# Patient Record
Sex: Female | Born: 1980 | Race: White | Hispanic: Yes | Marital: Married | Smoking: Never smoker
Health system: Southern US, Community
[De-identification: ages and names within clinical notes are randomized; demographics above are authoritative.]

## PROBLEM LIST (undated history)

## (undated) ENCOUNTER — Inpatient Hospital Stay (HOSPITAL_COMMUNITY): Payer: Self-pay

## (undated) DIAGNOSIS — K219 Gastro-esophageal reflux disease without esophagitis: Secondary | ICD-10-CM

## (undated) DIAGNOSIS — D649 Anemia, unspecified: Secondary | ICD-10-CM

## (undated) DIAGNOSIS — L03116 Cellulitis of left lower limb: Secondary | ICD-10-CM

## (undated) DIAGNOSIS — M722 Plantar fascial fibromatosis: Secondary | ICD-10-CM

## (undated) DIAGNOSIS — J45909 Unspecified asthma, uncomplicated: Secondary | ICD-10-CM

## (undated) DIAGNOSIS — L02416 Cutaneous abscess of left lower limb: Secondary | ICD-10-CM

## (undated) HISTORY — PX: HERNIA REPAIR: SHX51

## (undated) HISTORY — PX: WISDOM TOOTH EXTRACTION: SHX21

## (undated) HISTORY — PX: APPENDECTOMY: SHX54

---

## 2008-05-25 ENCOUNTER — Encounter (INDEPENDENT_AMBULATORY_CARE_PROVIDER_SITE_OTHER): Payer: Self-pay | Admitting: *Deleted

## 2008-06-06 ENCOUNTER — Ambulatory Visit: Payer: Self-pay | Admitting: Family Medicine

## 2008-06-08 ENCOUNTER — Encounter (INDEPENDENT_AMBULATORY_CARE_PROVIDER_SITE_OTHER): Payer: Self-pay | Admitting: *Deleted

## 2008-06-08 LAB — CONVERTED CEMR LAB
ALT: 23 units/L (ref 0–35)
AST: 18 units/L (ref 0–37)
Albumin: 4 g/dL (ref 3.5–5.2)
Alkaline Phosphatase: 43 units/L (ref 39–117)
BUN: 8 mg/dL (ref 6–23)
Basophils Relative: 0.6 % (ref 0.0–3.0)
CO2: 26 meq/L (ref 19–32)
Chloride: 103 meq/L (ref 96–112)
Creatinine, Ser: 0.7 mg/dL (ref 0.4–1.2)
Eosinophils Relative: 0.4 % (ref 0.0–5.0)
LDL Cholesterol: 85 mg/dL (ref 0–99)
Lymphocytes Relative: 19.5 % (ref 12.0–46.0)
Neutrophils Relative %: 73.6 % (ref 43.0–77.0)
RBC: 4.32 M/uL (ref 3.87–5.11)
TSH: 0.99 microintl units/mL (ref 0.35–5.50)
Total Bilirubin: 0.9 mg/dL (ref 0.3–1.2)
Total CHOL/HDL Ratio: 3.3
VLDL: 25 mg/dL (ref 0–40)
WBC: 8.3 10*3/uL (ref 4.5–10.5)

## 2009-01-03 ENCOUNTER — Inpatient Hospital Stay (HOSPITAL_COMMUNITY): Admission: AD | Admit: 2009-01-03 | Discharge: 2009-01-05 | Payer: Self-pay | Admitting: Obstetrics and Gynecology

## 2010-07-26 LAB — CBC
HCT: 30.1 % — ABNORMAL LOW (ref 36.0–46.0)
Hemoglobin: 11.9 g/dL — ABNORMAL LOW (ref 12.0–15.0)
MCHC: 32.8 g/dL (ref 30.0–36.0)
Platelets: 171 10*3/uL (ref 150–400)
RDW: 14.8 % (ref 11.5–15.5)
RDW: 14.2 % (ref 11.5–15.5)

## 2010-07-26 LAB — RPR: RPR Ser Ql: NONREACTIVE

## 2011-05-01 ENCOUNTER — Other Ambulatory Visit: Payer: Self-pay | Admitting: Family Medicine

## 2011-05-01 DIAGNOSIS — N63 Unspecified lump in unspecified breast: Secondary | ICD-10-CM

## 2011-05-05 ENCOUNTER — Ambulatory Visit
Admission: RE | Admit: 2011-05-05 | Discharge: 2011-05-05 | Disposition: A | Discharge status: Home / Self Care | Payer: PRIVATE HEALTH INSURANCE | Source: Ambulatory Visit | Attending: Family Medicine | Admitting: Family Medicine

## 2011-05-05 DIAGNOSIS — N63 Unspecified lump in unspecified breast: Secondary | ICD-10-CM

## 2011-12-10 ENCOUNTER — Other Ambulatory Visit: Payer: Self-pay | Admitting: Family Medicine

## 2011-12-10 DIAGNOSIS — R599 Enlarged lymph nodes, unspecified: Secondary | ICD-10-CM

## 2011-12-12 ENCOUNTER — Ambulatory Visit
Admission: RE | Admit: 2011-12-12 | Discharge: 2011-12-12 | Disposition: A | Discharge status: Home / Self Care | Payer: Self-pay | Source: Ambulatory Visit | Attending: Family Medicine | Admitting: Family Medicine

## 2011-12-12 DIAGNOSIS — R599 Enlarged lymph nodes, unspecified: Secondary | ICD-10-CM

## 2012-05-26 ENCOUNTER — Other Ambulatory Visit: Payer: Self-pay | Admitting: Family Medicine

## 2012-05-26 DIAGNOSIS — N6310 Unspecified lump in the right breast, unspecified quadrant: Secondary | ICD-10-CM

## 2012-06-07 ENCOUNTER — Ambulatory Visit
Admission: RE | Admit: 2012-06-07 | Discharge: 2012-06-07 | Disposition: A | Discharge status: Home / Self Care | Payer: Self-pay | Source: Ambulatory Visit | Attending: Family Medicine | Admitting: Family Medicine

## 2012-06-07 DIAGNOSIS — N6310 Unspecified lump in the right breast, unspecified quadrant: Secondary | ICD-10-CM

## 2013-01-13 ENCOUNTER — Other Ambulatory Visit: Payer: Self-pay

## 2013-01-13 DIAGNOSIS — N649 Disorder of breast, unspecified: Secondary | ICD-10-CM

## 2013-01-19 ENCOUNTER — Other Ambulatory Visit: Payer: Self-pay | Admitting: Family Medicine

## 2013-01-19 DIAGNOSIS — N649 Disorder of breast, unspecified: Secondary | ICD-10-CM

## 2013-01-20 ENCOUNTER — Ambulatory Visit
Admission: RE | Admit: 2013-01-20 | Discharge: 2013-01-20 | Disposition: A | Discharge status: Home / Self Care | Payer: 59 | Source: Ambulatory Visit

## 2013-01-20 DIAGNOSIS — N649 Disorder of breast, unspecified: Secondary | ICD-10-CM

## 2014-07-05 ENCOUNTER — Telehealth: Payer: Self-pay | Admitting: Family Medicine

## 2014-07-05 NOTE — Telephone Encounter (Signed)
Caller name: Sharen Counterrriaga, Patti Relation to pt: self  Call back number: [phone]   Reason for call:  Pt states she would like to re establish care with Dr. Beverely Lowabori please advise

## 2014-07-05 NOTE — Telephone Encounter (Signed)
Ok to re-establish 

## 2014-10-20 ENCOUNTER — Encounter: Payer: Self-pay | Admitting: Family Medicine

## 2014-10-24 ENCOUNTER — Encounter: Payer: Self-pay | Admitting: Family Medicine

## 2014-10-24 ENCOUNTER — Ambulatory Visit (INDEPENDENT_AMBULATORY_CARE_PROVIDER_SITE_OTHER): Payer: 59 | Admitting: Family Medicine

## 2014-10-24 ENCOUNTER — Ambulatory Visit: Payer: Self-pay | Admitting: Family Medicine

## 2014-10-24 VITALS — BP 102/72 | HR 66 | Temp 98.4°F | Resp 16 | Ht 63.0 in | Wt 146.2 lb

## 2014-10-24 DIAGNOSIS — Z0000 Encounter for general adult medical examination without abnormal findings: Secondary | ICD-10-CM | POA: Insufficient documentation

## 2014-10-24 NOTE — Progress Notes (Signed)
Pre visit review using our clinic review tool, if applicable. No additional management support is needed unless otherwise documented below in the visit note. 

## 2014-10-24 NOTE — Progress Notes (Signed)
   Subjective:    Patient ID: Natalie Marquez, female    DOB: 01/19/1981, 34 y.o.   MRN: [id number]  HPI New to re-establish.  GYN- Fogelman (UTD).  No concerns today.   Review of Systems Patient reports no vision/ hearing changes, adenopathy,fever, weight change,  persistant/recurrent hoarseness , swallowing issues, chest pain, palpitations, edema, persistant/recurrent cough, hemoptysis, dyspnea (rest/exertional/paroxysmal nocturnal), gastrointestinal bleeding (melena, rectal bleeding), abdominal pain, significant heartburn, bowel changes, GU symptoms (dysuria, hematuria, incontinence), Gyn symptoms (abnormal  bleeding, pain),  syncope, focal weakness, memory loss, numbness & tingling, skin/hair/nail changes, abnormal bruising or bleeding, anxiety, or depression.     Objective:   Physical Exam General Appearance:    Alert, cooperative, no distress, appears stated age  Head:    Normocephalic, without obvious abnormality, atraumatic  Eyes:    PERRL, conjunctiva/corneas clear, EOM's intact, fundi    benign, both eyes  Ears:    Normal TM's and external ear canals, both ears  Nose:   Nares normal, septum midline, mucosa normal, no drainage    or sinus tenderness  Throat:   Lips, mucosa, and tongue normal; teeth and gums normal  Neck:   Supple, symmetrical, trachea midline, no adenopathy;    Thyroid: no enlargement/tenderness/nodules  Back:     Symmetric, no curvature, ROM normal, no CVA tenderness  Lungs:     Clear to auscultation bilaterally, respirations unlabored  Chest Wall:    No tenderness or deformity   Heart:    Regular rate and rhythm, S1 and S2 normal, no murmur, rub   or gallop  Breast Exam:    Deferred to GYN  Abdomen:     Soft, non-tender, bowel sounds active all four quadrants,    no masses, no organomegaly  Genitalia:    Deferred to GYN  Rectal:    Extremities:   Extremities normal, atraumatic, no cyanosis or edema  Pulses:   2+ and symmetric all extremities  Skin:   Skin  color, texture, turgor normal, no rashes or lesions  Lymph nodes:   Cervical, supraclavicular, and axillary nodes normal  Neurologic:   CNII-XII intact, normal strength, sensation and reflexes    throughout          Assessment & Plan:

## 2014-10-24 NOTE — Assessment & Plan Note (Signed)
Pt's PE WNL.  UTD on GYN.  Check labs.  Anticipatory guidance provided.  

## 2014-10-24 NOTE — Patient Instructions (Signed)
Follow up in 1 year or as needed We'll notify you of your lab results and make any changes if needed Keep up the good work!  You look great! Call with any questions or concerns Welcome!  We're glad to have you! 

## 2014-10-25 LAB — HEPATIC FUNCTION PANEL
ALBUMIN: 3.9 g/dL (ref 3.5–5.2)
ALK PHOS: 55 U/L (ref 39–117)
ALT: 13 U/L (ref 0–35)
AST: 13 U/L (ref 0–37)
BILIRUBIN TOTAL: 0.4 mg/dL (ref 0.2–1.2)
Bilirubin, Direct: 0.1 mg/dL (ref 0.0–0.3)
Total Protein: 7 g/dL (ref 6.0–8.3)

## 2014-10-25 LAB — BASIC METABOLIC PANEL
BUN: 9 mg/dL (ref 6–23)
CO2: 26 mEq/L (ref 19–32)
Calcium: 9.2 mg/dL (ref 8.4–10.5)
Chloride: 108 mEq/L (ref 96–112)
Creatinine, Ser: 0.67 mg/dL (ref 0.40–1.20)
GFR: 107.49 mL/min (ref >60.00)
GLUCOSE: 54 mg/dL — AB (ref 70–99)
POTASSIUM: 3.6 meq/L (ref 3.5–5.1)
Sodium: 140 mEq/L (ref 135–145)

## 2014-10-25 LAB — CBC WITH DIFFERENTIAL/PLATELET
BASOS PCT: 0.6 % (ref 0.0–3.0)
Basophils Absolute: 0 10*3/uL (ref 0.0–0.1)
Eosinophils Absolute: 0 10*3/uL (ref 0.0–0.7)
Eosinophils Relative: 0.6 % (ref 0.0–5.0)
HCT: 36.8 % (ref 36.0–46.0)
HEMOGLOBIN: 12.3 g/dL (ref 12.0–15.0)
LYMPHS PCT: 24.4 % (ref 12.0–46.0)
Lymphs Abs: 1.8 10*3/uL (ref 0.7–4.0)
MCHC: 33.5 g/dL (ref 30.0–36.0)
MCV: 87.1 fl (ref 78.0–100.0)
MONOS PCT: 5.8 % (ref 3.0–12.0)
Monocytes Absolute: 0.4 10*3/uL (ref 0.1–1.0)
NEUTROS ABS: 5.1 10*3/uL (ref 1.4–7.7)
Neutrophils Relative %: 68.6 % (ref 43.0–77.0)
Platelets: 249 10*3/uL (ref 150.0–400.0)
RBC: 4.23 Mil/uL (ref 3.87–5.11)
RDW: 13.3 % (ref 11.5–15.5)
WBC: 7.4 10*3/uL (ref 4.0–10.5)

## 2014-10-25 LAB — LIPID PANEL
CHOL/HDL RATIO: 3
CHOLESTEROL: 114 mg/dL (ref 0–200)
HDL: 36.3 mg/dL — ABNORMAL LOW (ref >39.00)
LDL CALC: 63 mg/dL (ref 0–99)
NonHDL: 77.7
TRIGLYCERIDES: 72 mg/dL (ref 0.0–149.0)
VLDL: 14.4 mg/dL (ref 0.0–40.0)

## 2014-10-25 LAB — TSH: TSH: 1.39 u[IU]/mL (ref 0.35–4.50)

## 2014-10-25 LAB — VITAMIN D 25 HYDROXY (VIT D DEFICIENCY, FRACTURES): VITD: 19.1 ng/mL — AB (ref 30.00–100.00)

## 2014-10-26 ENCOUNTER — Telehealth: Payer: Self-pay | Admitting: Family Medicine

## 2014-10-26 ENCOUNTER — Ambulatory Visit: Payer: Self-pay | Admitting: Family Medicine

## 2014-10-26 MED ORDER — VITAMIN D (ERGOCALCIFEROL) 1.25 MG (50000 UNIT) PO CAPS: 50000.0000 [IU] | ORAL_CAPSULE | ORAL | Status: DC

## 2014-10-26 NOTE — Telephone Encounter (Signed)
Pt returning call for lab results. She is at work. Asks that you call her between 12:15-1:00pm (her lunch) or after 3:00pm.

## 2014-10-26 NOTE — Telephone Encounter (Signed)
Returned patient call and notified of lab results.  See lab results for documentation.

## 2014-12-26 ENCOUNTER — Telehealth: Payer: Self-pay | Admitting: *Deleted

## 2014-12-26 NOTE — Telephone Encounter (Signed)
Pt dropped off wellness form for [facility]. Filled out as much as possible and forwarded to Dr. Beverely Low. JG//CMA

## 2015-01-04 NOTE — Telephone Encounter (Signed)
Faxed successfully to [facility] at [phone] and mailed to pt's home address. Copy sent for scanning. JG/CMA

## 2015-10-26 ENCOUNTER — Encounter: Payer: 59 | Admitting: Family Medicine

## 2016-03-17 ENCOUNTER — Encounter: Payer: Self-pay | Admitting: Physician Assistant

## 2016-03-17 ENCOUNTER — Ambulatory Visit (INDEPENDENT_AMBULATORY_CARE_PROVIDER_SITE_OTHER): Payer: 59 | Admitting: Physician Assistant

## 2016-03-17 ENCOUNTER — Ambulatory Visit
Admission: RE | Admit: 2016-03-17 | Discharge: 2016-03-17 | Disposition: A | Discharge status: Home / Self Care | Payer: 59 | Source: Ambulatory Visit | Attending: Physician Assistant | Admitting: Physician Assistant

## 2016-03-17 VITALS — BP 110/78 | HR 81 | Temp 98.3°F | Resp 14 | Ht 63.0 in | Wt 157.0 lb

## 2016-03-17 DIAGNOSIS — S4990XA Unspecified injury of shoulder and upper arm, unspecified arm, initial encounter: Secondary | ICD-10-CM

## 2016-03-17 MED ORDER — CYCLOBENZAPRINE HCL 10 MG PO TABS: 10.0000 mg | ORAL_TABLET | Freq: Every day | ORAL | 0 refills | Status: DC

## 2016-03-17 MED ORDER — MELOXICAM 15 MG PO TABS: 15.0000 mg | ORAL_TABLET | Freq: Every day | ORAL | 0 refills | Status: DC
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
## 2016-03-17 NOTE — Progress Notes (Signed)
Pre visit review using our clinic review tool, if applicable. No additional management support is needed unless otherwise documented below in the visit note. 

## 2016-03-17 NOTE — Patient Instructions (Addendum)
[facility] on [address] for your x-ray.  We will call with your results.  Avoid heavy lifting or overexertion. Continue [facility] Hot. A heating pad to the area for 10 minutes is a good idea. You can do this 2-3 times per day.  Take the Mobic once daily as directed with food. Tylenol for breakthrough pain.

## 2016-04-21 NOTE — L&D Delivery Note (Signed)
Delivery Note At 9:11 PM a viable and healthy female was delivered via  (Presentation: OA;  ).  APGAR:9 ,9 ; weight  pending  Placenta status: spontaneous, complete, .  Cord:  with the following complications: none .  Cord pH: N/A Anesthesia:  Epidural  Episiotomy:  none Lacerations:  1st degree perineal Suture Repair: 3.0 vicryl rapide Est. Blood Loss (mL):  250 cc  Mom to postpartum.  Baby to Couplet care / Skin to Skin.  Robley FriesVaishali R Tammie Ellsworth 03/17/2017, 9:31 PM

## 2016-09-01 DIAGNOSIS — Z118 Encounter for screening for other infectious and parasitic diseases: Secondary | ICD-10-CM | POA: Diagnosis not present

## 2016-12-27 ENCOUNTER — Inpatient Hospital Stay (HOSPITAL_COMMUNITY): Payer: 59

## 2016-12-27 ENCOUNTER — Inpatient Hospital Stay (HOSPITAL_COMMUNITY)
Admission: AD | Admit: 2016-12-27 | Discharge: 2016-12-27 | Disposition: A | Discharge status: Home / Self Care | Payer: 59 | Source: Ambulatory Visit | Attending: Obstetrics and Gynecology | Admitting: Obstetrics and Gynecology

## 2016-12-27 ENCOUNTER — Encounter (HOSPITAL_COMMUNITY): Payer: Self-pay

## 2016-12-27 ENCOUNTER — Other Ambulatory Visit: Payer: Self-pay | Admitting: Certified Nurse Midwife

## 2016-12-27 ENCOUNTER — Inpatient Hospital Stay (HOSPITAL_BASED_OUTPATIENT_CLINIC_OR_DEPARTMENT_OTHER)
Admission: RE | Admit: 2016-12-27 | Discharge: 2016-12-27 | Disposition: A | Discharge status: Home / Self Care | Payer: 59 | Source: Ambulatory Visit | Attending: Advanced Practice Midwife | Admitting: Advanced Practice Midwife

## 2016-12-27 DIAGNOSIS — L03116 Cellulitis of left lower limb: Secondary | ICD-10-CM

## 2016-12-27 DIAGNOSIS — O26892 Other specified pregnancy related conditions, second trimester: Secondary | ICD-10-CM | POA: Insufficient documentation

## 2016-12-27 DIAGNOSIS — O9989 Other specified diseases and conditions complicating pregnancy, childbirth and the puerperium: Secondary | ICD-10-CM

## 2016-12-27 DIAGNOSIS — R509 Fever, unspecified: Secondary | ICD-10-CM | POA: Diagnosis not present

## 2016-12-27 DIAGNOSIS — M79609 Pain in unspecified limb: Secondary | ICD-10-CM | POA: Diagnosis not present

## 2016-12-27 DIAGNOSIS — M79605 Pain in left leg: Secondary | ICD-10-CM | POA: Insufficient documentation

## 2016-12-27 DIAGNOSIS — M7989 Other specified soft tissue disorders: Secondary | ICD-10-CM

## 2016-12-27 DIAGNOSIS — D72829 Elevated white blood cell count, unspecified: Secondary | ICD-10-CM | POA: Diagnosis not present

## 2016-12-27 DIAGNOSIS — R0602 Shortness of breath: Secondary | ICD-10-CM | POA: Diagnosis not present

## 2016-12-27 DIAGNOSIS — R0600 Dyspnea, unspecified: Secondary | ICD-10-CM | POA: Insufficient documentation

## 2016-12-27 DIAGNOSIS — Z880 Allergy status to penicillin: Secondary | ICD-10-CM | POA: Insufficient documentation

## 2016-12-27 DIAGNOSIS — Z3A27 27 weeks gestation of pregnancy: Secondary | ICD-10-CM | POA: Diagnosis not present

## 2016-12-27 DIAGNOSIS — E7212 Methylenetetrahydrofolate reductase deficiency: Secondary | ICD-10-CM | POA: Diagnosis present

## 2016-12-27 DIAGNOSIS — Z7982 Long term (current) use of aspirin: Secondary | ICD-10-CM | POA: Insufficient documentation

## 2016-12-27 DIAGNOSIS — O99280 Endocrine, nutritional and metabolic diseases complicating pregnancy, unspecified trimester: Secondary | ICD-10-CM | POA: Diagnosis present

## 2016-12-27 LAB — URINALYSIS, ROUTINE W REFLEX MICROSCOPIC
Bilirubin Urine: NEGATIVE
Glucose, UA: NEGATIVE mg/dL
Hgb urine dipstick: NEGATIVE
LEUKOCYTES UA: NEGATIVE
NITRITE: NEGATIVE
PH: 8.5 — AB (ref 5.0–8.0)
Protein, ur: 30 mg/dL — AB
SPECIFIC GRAVITY, URINE: 1.01 (ref 1.005–1.030)

## 2016-12-27 LAB — CBC WITH DIFFERENTIAL/PLATELET
Basophils Absolute: 0 10*3/uL (ref 0.0–0.1)
Basophils Relative: 0 %
EOS ABS: 0 10*3/uL (ref 0.0–0.7)
EOS PCT: 0 %
HCT: 30.1 % — ABNORMAL LOW (ref 36.0–46.0)
Hemoglobin: 10.4 g/dL — ABNORMAL LOW (ref 12.0–15.0)
LYMPHS ABS: 0.6 10*3/uL — AB (ref 0.7–4.0)
Lymphocytes Relative: 3 %
MCH: 29.8 pg (ref 26.0–34.0)
MCHC: 34.6 g/dL (ref 30.0–36.0)
MCV: 86.2 fL (ref 78.0–100.0)
Monocytes Absolute: 0.5 10*3/uL (ref 0.1–1.0)
Monocytes Relative: 3 %
Neutro Abs: 17.6 10*3/uL — ABNORMAL HIGH (ref 1.7–7.7)
Neutrophils Relative %: 94 %
PLATELETS: 206 10*3/uL (ref 150–400)
RBC: 3.49 MIL/uL — AB (ref 3.87–5.11)
RDW: 13.7 % (ref 11.5–15.5)
WBC: 18.8 10*3/uL — AB (ref 4.0–10.5)

## 2016-12-27 LAB — URINALYSIS, MICROSCOPIC (REFLEX)

## 2016-12-27 LAB — INFLUENZA PANEL BY PCR (TYPE A & B)
INFLAPCR: NEGATIVE
Influenza B By PCR: NEGATIVE

## 2016-12-27 LAB — LACTIC ACID, PLASMA: Lactic Acid, Venous: 1.2 mmol/L (ref 0.5–1.9)

## 2016-12-27 MED ORDER — CEPHALEXIN 500 MG PO CAPS: 500.0000 mg | ORAL_CAPSULE | Freq: Four times a day (QID) | ORAL | 2 refills | Status: DC

## 2016-12-27 MED ORDER — CEFTRIAXONE SODIUM 1 G IJ SOLR
1.0000 g | Freq: Once | INTRAMUSCULAR | Status: AC
  Administered 2016-12-27: 1 g via INTRAVENOUS
  Filled 2016-12-27: qty 10

## 2016-12-27 MED ORDER — CEFTRIAXONE SODIUM 1 G IJ SOLR
500.0000 mg | Freq: Once | INTRAMUSCULAR | Status: DC
  Filled 2016-12-27: qty 10

## 2016-12-27 MED ORDER — ACETAMINOPHEN 325 MG PO TABS
650.0000 mg | ORAL_TABLET | Freq: Four times a day (QID) | ORAL | Status: DC | PRN
  Administered 2016-12-27: 650 mg via ORAL
  Filled 2016-12-27: qty 2

## 2016-12-27 MED ORDER — LACTATED RINGERS IV BOLUS (SEPSIS)
1000.0000 mL | Freq: Once | INTRAVENOUS | Status: AC
  Administered 2016-12-27: 1000 mL via INTRAVENOUS

## 2016-12-27 NOTE — Discharge Instructions (Signed)

## 2016-12-27 NOTE — MAU Note (Addendum)
Pt was feeling SOB at work yesterday and having the chills.  Was vomiting after eating last night. Having pain down her left thigh and lower leg.  Today the pain has not gotten any better and she is still SOB. Pt was limping when she was walking.

## 2016-12-27 NOTE — Progress Notes (Signed)
VASCULAR LAB PRELIMINARY  PRELIMINARY  PRELIMINARY  PRELIMINARY  Left lower extremity venous duplex completed.    Preliminary report:  There is no DVT or SVT noted in the left lower extremity.  Multiple enlarged inguinal lymph nodes noted.  Lyliana Dicenso, RVT 12/27/2016, 11:58 AM

## 2016-12-27 NOTE — MAU Provider Note (Signed)
Chief Complaint:  Shortness of Breath; Leg Pain; and Fever   First Provider Initiated Contact with Patient 12/27/16 [phone]     HPI: Natalie Marquez is a 36 y.o. G5P1031 at 4827w0dwho presents to maternity admissions reporting left leg pain and redness.  Began feeling short of breath yesterday with chills and fever.  Had some vomiting last night.  Had an incident like this years ago and was hospitalized for 10 days.  Also described this pain early pregnancy on left thigh.  History is remarkable for MTHFR homozygous and is on ASA for this.. She reports good fetal movement, denies LOF, vaginal bleeding, vaginal itching/burning, urinary symptoms, h/a, dizziness, n/v, diarrhea, constipation.  Shortness of Breath  This is a new problem. The current episode started yesterday. The problem occurs constantly. The problem has been unchanged. Associated symptoms include a fever, leg pain, leg swelling (pt reports it but there is no obvious swelling) and orthopnea. Pertinent negatives include no abdominal pain, claudication, rhinorrhea, sore throat, sputum production, syncope or vomiting. The symptoms are aggravated by any activity. Risk factors: MTHFR and pregnancy status. She has tried nothing for the symptoms.  Leg Pain   The incident occurred 12 to 24 hours ago. There was no injury mechanism. The pain is present in the left leg. The pain is moderate. The pain has been constant since onset. Associated symptoms include an inability to bear weight. Pertinent negatives include no loss of sensation, muscle weakness or numbness. She reports no foreign bodies present. The symptoms are aggravated by movement and weight bearing. She has tried nothing for the symptoms.  Fever   This is a new problem. The current episode started yesterday. The problem occurs constantly. The problem has been gradually improving. The maximum temperature noted was 100 to 100.9 F. The temperature was taken using an oral thermometer. Pertinent  negatives include no abdominal pain, congestion, coughing, diarrhea, sore throat or vomiting. She has tried nothing for the symptoms.    RN note: Pt was feeling SOB at work yesterday and having the chills.  Was vomiting after eating last night. Having pain down her left thigh and lower leg.  Today the pain has not gotten any better and she is still SOB. Pt was limping when she was walking.  Past Medical History: History reviewed. No pertinent past medical history.  Past obstetric history: OB History  Gravida Para Term Preterm AB Living  5 1 1   3 1   SAB TAB Ectopic Multiple Live Births  3       1    # Outcome Date GA Lbr Len/2nd Weight Sex Delivery Anes PTL Lv  5 Current           4 Term         LIV  3 SAB           2 SAB           1 SAB               Past Surgical History: Past Surgical History:  Procedure Laterality Date  . APPENDECTOMY    . HERNIA REPAIR    . WISDOM TOOTH EXTRACTION      Family History: Family History  Problem Relation Age of Onset  . Hypertension Mother   . Hypertension Father   . Cancer Maternal Grandmother        uterine  . Heart disease Maternal Grandfather   . Thyroid disease Paternal Grandmother     Social History: Social  History  Substance Use Topics  . Smoking status: Never Smoker  . Smokeless tobacco: Never Used  . Alcohol use No    Allergies:  Allergies  Allergen Reactions  . Penicillins Rash    Meds:  Prescriptions Prior to Admission  Medication Sig Dispense Refill Last Dose  . cyclobenzaprine (FLEXERIL) 10 MG tablet Take 1 tablet (10 mg total) by mouth at bedtime. 15 tablet 0   . meloxicam (MOBIC) 15 MG tablet Take 1 tablet (15 mg total) by mouth daily. 30 tablet 0     I have reviewed patient's Past Medical Hx, Surgical Hx, Family Hx, Social Hx, medications and allergies.   ROS:  Review of Systems  Constitutional: Positive for fever.  HENT: Negative for congestion, rhinorrhea and sore throat.   Respiratory:  Positive for shortness of breath. Negative for cough and sputum production.   Cardiovascular: Positive for orthopnea and leg swelling (pt reports it but there is no obvious swelling). Negative for claudication and syncope.  Gastrointestinal: Negative for abdominal pain, diarrhea and vomiting.  Neurological: Negative for numbness.   Other systems negative  Physical Exam  Patient Vitals for the past 24 hrs:  BP Temp Temp src Pulse Resp SpO2  12/27/16 1009 - - - (!) 124 - 100 %  12/27/16 1006 - - - (!) 119 - 98 %  12/27/16 1001 - - - - - 99 %  12/27/16 0956 - - - - - 96 %  12/27/16 0951 - - - - - 99 %  12/27/16 0949 - - - (!) 116 - 97 %  12/27/16 0947 119/66 99.6 F (37.6 C) Oral (!) 119 20 96 %   Constitutional: Well-developed, well-nourished female in no acute distress, but dyspneic and somewhat ill-appearing.    Cardiovascular: Tachycardic with regular rhythm   S1/S2 audible, with no ectopy Respiratory: normal effort, but rapid respiration clear to auscultation bilaterally, diminished bases GI: Abd soft, non-tender, gravid appropriate for gestational age.   No rebound or guarding. MS: Extremities nontender, no edema, normal ROM     There is an oval area of erethema on left lower extremity,  Area is warm and slightly tender, but not edematous   See photo below.  Circumference marked Neurologic: Alert and oriented x 4.  GU: Neg CVAT.  PELVIC EXAM: not indicated   FHT:  Baseline 150 , moderate variability, accelerations present, no decelerations Contractions: Rare      Labs: Results for orders placed or performed during the hospital encounter of 12/27/16 (from the past 24 hour(s))  Urinalysis, Routine w reflex microscopic     Status: Abnormal   Collection Time: 12/27/16  9:38 AM  Result Value Ref Range   Color, Urine AMBER (A) YELLOW   APPearance HAZY (A) CLEAR   Specific Gravity, Urine 1.010 1.005 - 1.030   pH 8.5 (H) 5.0 - 8.0   Glucose, UA NEGATIVE NEGATIVE mg/dL   Hgb  urine dipstick NEGATIVE NEGATIVE   Bilirubin Urine NEGATIVE NEGATIVE   Ketones, ur >80 (A) NEGATIVE mg/dL   Protein, ur 30 (A) NEGATIVE mg/dL   Nitrite NEGATIVE NEGATIVE   Leukocytes, UA NEGATIVE NEGATIVE  Urinalysis, Microscopic (reflex)     Status: Abnormal   Collection Time: 12/27/16  9:38 AM  Result Value Ref Range   RBC / HPF 0-5 0 - 5 RBC/hpf   WBC, UA 0-5 0 - 5 WBC/hpf   Bacteria, UA FEW (A) NONE SEEN   Squamous Epithelial / LPF 6-30 (A) NONE SEEN  Amorphous Crystal PRESENT   CBC with Differential/Platelet     Status: Abnormal   Collection Time: 12/27/16 10:28 AM  Result Value Ref Range   WBC 18.8 (H) 4.0 - 10.5 K/uL   RBC 3.49 (L) 3.87 - 5.11 MIL/uL   Hemoglobin 10.4 (L) 12.0 - 15.0 g/dL   HCT 86.5 (L) 78.4 - 69.6 %   MCV 86.2 78.0 - 100.0 fL   MCH 29.8 26.0 - 34.0 pg   MCHC 34.6 30.0 - 36.0 g/dL   RDW 29.5 28.4 - 13.2 %   Platelets 206 150 - 400 K/uL   Neutrophils Relative % 94 %   Neutro Abs 17.6 (H) 1.7 - 7.7 K/uL   Lymphocytes Relative 3 %   Lymphs Abs 0.6 (L) 0.7 - 4.0 K/uL   Monocytes Relative 3 %   Monocytes Absolute 0.5 0.1 - 1.0 K/uL   Eosinophils Relative 0 %   Eosinophils Absolute 0.0 0.0 - 0.7 K/uL   Basophils Relative 0 %   Basophils Absolute 0.0 0.0 - 0.1 K/uL  Influenza panel by PCR (type A & B)     Status: None   Collection Time: 12/27/16 11:40 AM  Result Value Ref Range   Influenza A By PCR NEGATIVE NEGATIVE   Influenza B By PCR NEGATIVE NEGATIVE  Lactic acid, plasma     Status: None   Collection Time: 12/27/16 12:02 PM  Result Value Ref Range   Lactic Acid, Venous 1.2 0.5 - 1.9 mmol/L    Imaging: Dg Chest 2 View  Result Date: 12/27/2016 CLINICAL DATA:  Shortness of breath.  Fever. EXAM: CHEST  2 VIEW COMPARISON:  None. FINDINGS: The heart size and mediastinal contours are within normal limits. Both lungs are clear. The visualized skeletal structures are unremarkable. IMPRESSION: No active cardiopulmonary disease. Electronically Signed   By:  Gerome Sam III M.D   On: 12/27/2016 10:47   Verbal report on LE doppler was negative  MAU Course/MDM: I have ordered labs and reviewed results. Leukocytosis with left shift.  UA clear.  CXR clear.  NST reviewed and found to be reassuring. Consult Dr Billy Coast with presentation, exam findings and test results.  Treatments in MAU included IV hydration and IV Rocephin.  Tylenol given for fever.  Erethema marked and photographed.  DD:  Cellultis vs ?Lyme.   DVT ruled out.  Tachypnea and lowered O2 saturation improved. .    Discussed findings.  Warning signs to return for were reviewed.  Has appt on Tuesday with Dr Ernestina Penna  Assessment: 1. Dyspnea   2.     Left leg pain and erethema 3.     Fever of unknown origin 4.      Leukocytosis with left shift   Plan: Discharge home Rx Keflex x 7 days Tylenol prn fever Preterm Labor precautions and fetal kick counts Follow up in Office for prenatal visits and recheck of status Out of work until Tuesday  Encouraged to return here or to other Urgent Care/ED if she develops worsening of symptoms, increase in pain, fever, or other concerning symptoms.   Pt stable at time of discharge.  Wynelle Bourgeois CNM, MSN Certified Nurse-Midwife 12/27/2016 10:43 AM

## 2016-12-27 NOTE — Progress Notes (Signed)
Spoke with Selena BattenKim in Vascular Lab about order, will come asap.

## 2016-12-29 LAB — B. BURGDORFI ANTIBODIES: B burgdorferi Ab IgG+IgM: <0.91 {ISR} (ref 0.00–0.90)

## 2017-01-07 DIAGNOSIS — Z23 Encounter for immunization: Secondary | ICD-10-CM | POA: Diagnosis not present

## 2017-01-21 DIAGNOSIS — Z23 Encounter for immunization: Secondary | ICD-10-CM | POA: Diagnosis not present

## 2017-03-17 ENCOUNTER — Encounter (HOSPITAL_COMMUNITY): Payer: Self-pay | Admitting: *Deleted

## 2017-03-17 ENCOUNTER — Inpatient Hospital Stay (HOSPITAL_COMMUNITY)
Admission: AD | Admit: 2017-03-17 | Discharge: 2017-03-19 | DRG: 806 | Disposition: A | Discharge status: Home / Self Care | Payer: 59 | Source: Ambulatory Visit | Attending: Obstetrics & Gynecology | Admitting: Obstetrics & Gynecology

## 2017-03-17 ENCOUNTER — Encounter (HOSPITAL_COMMUNITY): Payer: Self-pay | Admitting: Certified Registered Nurse Anesthetist

## 2017-03-17 ENCOUNTER — Inpatient Hospital Stay (HOSPITAL_COMMUNITY): Payer: 59 | Admitting: Anesthesiology

## 2017-03-17 DIAGNOSIS — K219 Gastro-esophageal reflux disease without esophagitis: Secondary | ICD-10-CM | POA: Diagnosis present

## 2017-03-17 DIAGNOSIS — Z3483 Encounter for supervision of other normal pregnancy, third trimester: Secondary | ICD-10-CM | POA: Diagnosis present

## 2017-03-17 DIAGNOSIS — Z3490 Encounter for supervision of normal pregnancy, unspecified, unspecified trimester: Secondary | ICD-10-CM

## 2017-03-17 DIAGNOSIS — O99284 Endocrine, nutritional and metabolic diseases complicating childbirth: Secondary | ICD-10-CM | POA: Diagnosis present

## 2017-03-17 DIAGNOSIS — O9962 Diseases of the digestive system complicating childbirth: Secondary | ICD-10-CM | POA: Diagnosis present

## 2017-03-17 DIAGNOSIS — E7212 Methylenetetrahydrofolate reductase deficiency: Secondary | ICD-10-CM | POA: Diagnosis present

## 2017-03-17 DIAGNOSIS — Z3A38 38 weeks gestation of pregnancy: Secondary | ICD-10-CM

## 2017-03-17 HISTORY — DX: Anemia, unspecified: D64.9

## 2017-03-17 HISTORY — DX: Cutaneous abscess of left lower limb: L02.416

## 2017-03-17 HISTORY — DX: Gastro-esophageal reflux disease without esophagitis: K21.9

## 2017-03-17 HISTORY — DX: Cellulitis of left lower limb: L03.116

## 2017-03-17 LAB — OB RESULTS CONSOLE HEPATITIS B SURFACE ANTIGEN: HEP B S AG: NEGATIVE

## 2017-03-17 LAB — ABO/RH: ABO/RH(D): O POS

## 2017-03-17 LAB — TYPE AND SCREEN
ABO/RH(D): O POS
ANTIBODY SCREEN: NEGATIVE

## 2017-03-17 LAB — CBC
HCT: 33.7 % — ABNORMAL LOW (ref 36.0–46.0)
HEMOGLOBIN: 11.2 g/dL — AB (ref 12.0–15.0)
MCH: 29.9 pg (ref 26.0–34.0)
MCHC: 33.2 g/dL (ref 30.0–36.0)
MCV: 89.9 fL (ref 78.0–100.0)
Platelets: 228 10*3/uL (ref 150–400)
RBC: 3.75 MIL/uL — AB (ref 3.87–5.11)
RDW: 15.4 % (ref 11.5–15.5)
WBC: 10.6 10*3/uL — ABNORMAL HIGH (ref 4.0–10.5)

## 2017-03-17 LAB — OB RESULTS CONSOLE RUBELLA ANTIBODY, IGM: RUBELLA: IMMUNE

## 2017-03-17 LAB — OB RESULTS CONSOLE GC/CHLAMYDIA
CHLAMYDIA, DNA PROBE: NEGATIVE
Gonorrhea: NEGATIVE

## 2017-03-17 LAB — OB RESULTS CONSOLE ANTIBODY SCREEN: Antibody Screen: NEGATIVE

## 2017-03-17 LAB — OB RESULTS CONSOLE RPR: RPR: NONREACTIVE

## 2017-03-17 LAB — OB RESULTS CONSOLE ABO/RH: RH TYPE: POSITIVE

## 2017-03-17 LAB — OB RESULTS CONSOLE GBS: GBS: NEGATIVE

## 2017-03-17 LAB — OB RESULTS CONSOLE HIV ANTIBODY (ROUTINE TESTING): HIV: NONREACTIVE

## 2017-03-17 MED ORDER — EPHEDRINE 5 MG/ML INJ
10.0000 mg | INTRAVENOUS | Status: DC | PRN
  Filled 2017-03-17: qty 2

## 2017-03-17 MED ORDER — FENTANYL 2.5 MCG/ML BUPIVACAINE 1/10 % EPIDURAL INFUSION (WH - ANES)
14.0000 mL/h | INTRAMUSCULAR | Status: DC | PRN
  Administered 2017-03-17: 14 mL/h via EPIDURAL
  Filled 2017-03-17: qty 100

## 2017-03-17 MED ORDER — BENZOCAINE-MENTHOL 20-0.5 % EX AERO: 1.0000 "application " | INHALATION_SPRAY | CUTANEOUS | Status: DC | PRN

## 2017-03-17 MED ORDER — PHENYLEPHRINE 40 MCG/ML (10ML) SYRINGE FOR IV PUSH (FOR BLOOD PRESSURE SUPPORT)
80.0000 ug | PREFILLED_SYRINGE | INTRAVENOUS | Status: DC | PRN
  Filled 2017-03-17: qty 10
  Filled 2017-03-17: qty 5

## 2017-03-17 MED ORDER — OXYTOCIN 40 UNITS IN LACTATED RINGERS INFUSION - SIMPLE MED
1.0000 m[IU]/min | INTRAVENOUS | Status: DC
  Administered 2017-03-17: 2 m[IU]/min via INTRAVENOUS
  Filled 2017-03-17 (×2): qty 1000

## 2017-03-17 MED ORDER — DIBUCAINE 1 % RE OINT: 1.0000 "application " | TOPICAL_OINTMENT | RECTAL | Status: DC | PRN

## 2017-03-17 MED ORDER — LIDOCAINE HCL (PF) 1 % IJ SOLN
30.0000 mL | INTRAMUSCULAR | Status: DC | PRN
  Filled 2017-03-17: qty 30

## 2017-03-17 MED ORDER — FAMOTIDINE 20 MG PO TABS
10.0000 mg | ORAL_TABLET | Freq: Every day | ORAL | Status: DC
  Administered 2017-03-18: 10 mg via ORAL
  Filled 2017-03-17: qty 1

## 2017-03-17 MED ORDER — COCONUT OIL OIL: 1.0000 "application " | TOPICAL_OIL | Status: DC | PRN

## 2017-03-17 MED ORDER — OXYTOCIN 40 UNITS IN LACTATED RINGERS INFUSION - SIMPLE MED
2.5000 [IU]/h | INTRAVENOUS | Status: DC
  Administered 2017-03-17: 2.5 [IU]/h via INTRAVENOUS

## 2017-03-17 MED ORDER — LACTATED RINGERS IV SOLN
INTRAVENOUS | Status: DC
  Administered 2017-03-17: 125 mL/h via INTRAVENOUS

## 2017-03-17 MED ORDER — DIPHENHYDRAMINE HCL 25 MG PO CAPS: 25.0000 mg | ORAL_CAPSULE | Freq: Four times a day (QID) | ORAL | Status: DC | PRN

## 2017-03-17 MED ORDER — ONDANSETRON HCL 4 MG/2ML IJ SOLN: 4.0000 mg | INTRAMUSCULAR | Status: DC | PRN

## 2017-03-17 MED ORDER — FOLIC ACID 1 MG PO TABS
4.0000 mg | ORAL_TABLET | Freq: Every day | ORAL | Status: DC
  Administered 2017-03-18: 4 mg via ORAL
  Filled 2017-03-17 (×3): qty 4

## 2017-03-17 MED ORDER — IBUPROFEN 600 MG PO TABS
600.0000 mg | ORAL_TABLET | Freq: Four times a day (QID) | ORAL | Status: DC
  Administered 2017-03-18 – 2017-03-19 (×6): 600 mg via ORAL
  Filled 2017-03-17 (×6): qty 1

## 2017-03-17 MED ORDER — SIMETHICONE 80 MG PO CHEW: 80.0000 mg | CHEWABLE_TABLET | ORAL | Status: DC | PRN

## 2017-03-17 MED ORDER — OXYCODONE-ACETAMINOPHEN 5-325 MG PO TABS: 1.0000 | ORAL_TABLET | ORAL | Status: DC | PRN

## 2017-03-17 MED ORDER — DIPHENHYDRAMINE HCL 50 MG/ML IJ SOLN: 12.5000 mg | INTRAMUSCULAR | Status: DC | PRN

## 2017-03-17 MED ORDER — TETANUS-DIPHTH-ACELL PERTUSSIS 5-2.5-18.5 LF-MCG/0.5 IM SUSP: 0.5000 mL | Freq: Once | INTRAMUSCULAR | Status: DC

## 2017-03-17 MED ORDER — ZOLPIDEM TARTRATE 5 MG PO TABS: 5.0000 mg | ORAL_TABLET | Freq: Every evening | ORAL | Status: DC | PRN

## 2017-03-17 MED ORDER — SENNOSIDES-DOCUSATE SODIUM 8.6-50 MG PO TABS
2.0000 | ORAL_TABLET | ORAL | Status: DC
  Administered 2017-03-18 – 2017-03-19 (×2): 2 via ORAL
  Filled 2017-03-17 (×2): qty 2

## 2017-03-17 MED ORDER — WITCH HAZEL-GLYCERIN EX PADS: 1.0000 "application " | MEDICATED_PAD | CUTANEOUS | Status: DC | PRN

## 2017-03-17 MED ORDER — OXYCODONE-ACETAMINOPHEN 5-325 MG PO TABS: 2.0000 | ORAL_TABLET | ORAL | Status: DC | PRN

## 2017-03-17 MED ORDER — SOD CITRATE-CITRIC ACID 500-334 MG/5ML PO SOLN: 30.0000 mL | ORAL | Status: DC | PRN

## 2017-03-17 MED ORDER — ACETAMINOPHEN 325 MG PO TABS
650.0000 mg | ORAL_TABLET | ORAL | Status: DC | PRN
  Administered 2017-03-17: 650 mg via ORAL
  Filled 2017-03-17: qty 2

## 2017-03-17 MED ORDER — OXYTOCIN BOLUS FROM INFUSION
500.0000 mL | Freq: Once | INTRAVENOUS | Status: AC
  Administered 2017-03-17: 500 mL via INTRAVENOUS

## 2017-03-17 MED ORDER — ONDANSETRON HCL 4 MG/2ML IJ SOLN: 4.0000 mg | Freq: Four times a day (QID) | INTRAMUSCULAR | Status: DC | PRN

## 2017-03-17 MED ORDER — LACTATED RINGERS IV SOLN: 500.0000 mL | Freq: Once | INTRAVENOUS | Status: DC

## 2017-03-17 MED ORDER — TERBUTALINE SULFATE 1 MG/ML IJ SOLN
0.2500 mg | Freq: Once | INTRAMUSCULAR | Status: DC | PRN
  Filled 2017-03-17: qty 1

## 2017-03-17 MED ORDER — PRENATAL MULTIVITAMIN CH
1.0000 | ORAL_TABLET | Freq: Every day | ORAL | Status: DC
  Administered 2017-03-18: 1 via ORAL
  Filled 2017-03-17: qty 1

## 2017-03-17 MED ORDER — PHENYLEPHRINE 40 MCG/ML (10ML) SYRINGE FOR IV PUSH (FOR BLOOD PRESSURE SUPPORT)
80.0000 ug | PREFILLED_SYRINGE | INTRAVENOUS | Status: DC | PRN
  Filled 2017-03-17: qty 5

## 2017-03-17 MED ORDER — ONDANSETRON HCL 4 MG PO TABS: 4.0000 mg | ORAL_TABLET | ORAL | Status: DC | PRN

## 2017-03-17 MED ORDER — LIDOCAINE HCL (PF) 1 % IJ SOLN
INTRAMUSCULAR | Status: DC | PRN
  Administered 2017-03-17: 8 mL via EPIDURAL
  Administered 2017-03-17: 4 mL via EPIDURAL

## 2017-03-17 MED ORDER — ACETAMINOPHEN 325 MG PO TABS
650.0000 mg | ORAL_TABLET | ORAL | Status: DC | PRN
Start: 2017-03-17 — End: 2017-03-19

## 2017-03-17 MED ORDER — LACTATED RINGERS IV SOLN: 500.0000 mL | INTRAVENOUS | Status: DC | PRN

## 2017-03-17 NOTE — Progress Notes (Signed)
S: Doing well, no complaints, pain well controlled with3 epidural  O: BP (!) 91/51   Pulse 94   Temp 98.2 F (36.8 C) (Oral)   Resp 16   Ht 5\' 3"  (1.6 m)   Wt 73 kg (161 lb)   SpO2 99%   BMI 28.52 kg/m    FHT:  FHR: 140s bpm, variability: moderate,  accelerations:  Present,  decelerations:  Present sharp, short variables UC:   Not tracing SVE:   Dilation: 4 Effacement (%): 70 Station: -2 Exam by:: lee  5/90/-1  A / P:  36 y.o.  Obstetric History   G5   P1   T1   P0   A3   L1    SAB3   TAB0   Ectopic0   Multiple0   Live Births1    at 5371w3d IOL after SROM, good progress. IUPC placed to monitor pitocin as external toco not ttracing. Will watch variables for now but iff continue may need amnioinfusion  Fetal Wellbeing:  Category I Pain Control:  Epidural  Anticipated MOD:  NSVD  Natalie Marquez 03/17/2017, 7:03 PM

## 2017-03-17 NOTE — Anesthesia Preprocedure Evaluation (Signed)
Anesthesia Evaluation  Patient identified by MRN, date of birth, ID band Patient awake    Reviewed: Allergy & Precautions, NPO status , Patient's Chart, lab work & pertinent test results  Airway Mallampati: II  TM Distance: >3 FB Neck ROM: Full    Dental  (+) Dental Advisory Given   Pulmonary neg pulmonary ROS,    Pulmonary exam normal breath sounds clear to auscultation       Cardiovascular negative cardio ROS Normal cardiovascular exam Rhythm:Regular Rate:Normal     Neuro/Psych negative neurological ROS  negative psych ROS   GI/Hepatic Neg liver ROS, GERD  ,  Endo/Other  negative endocrine ROS  Renal/GU negative Renal ROS  negative genitourinary   Musculoskeletal negative musculoskeletal ROS (+)   Abdominal   Peds  Hematology  (+) Blood dyscrasia, anemia , MTHFR deficiency on aspirin   Anesthesia Other Findings   Reproductive/Obstetrics (+) Pregnancy                             Anesthesia Physical Anesthesia Plan  ASA: II  Anesthesia Plan: Epidural   Post-op Pain Management:    Induction:   PONV Risk Score and Plan:   Airway Management Planned: Natural Airway  Additional Equipment:   Intra-op Plan:   Post-operative Plan:   Informed Consent: I have reviewed the patients History and Physical, chart, labs and discussed the procedure including the risks, benefits and alternatives for the proposed anesthesia with the patient or authorized representative who has indicated his/her understanding and acceptance.     Plan Discussed with:   Anesthesia Plan Comments: (Labs reviewed. Platelets acceptable, patient not taking any blood thinning medications. Risks and benefits discussed with patient, patient expressed understanding and wished to proceed.)        Anesthesia Quick Evaluation

## 2017-03-17 NOTE — Anesthesia Pain Management Evaluation Note (Signed)
  CRNA Pain Management Visit Note  Patient: Natalie Marquez, 36 y.o., female  "Hello I am a member of the anesthesia team at [facility]. We have an anesthesia team available at all times to provide care throughout the hospital, including epidural management and anesthesia for C-section. I don't know your plan for the delivery whether it a natural birth, water birth, IV sedation, nitrous supplementation, doula or epidural, but we want to meet your pain goals."   1.Was your pain managed to your expectations on prior hospitalizations?   Yes   2.What is your expectation for pain management during this hospitalization?     Epidural  3.How can we help you reach that goal? Epidural when ready.  Record the patient's initial score and the patient's pain goal.   Pain: 4-6  Pain Goal: 4-6 [facility] wants you to be able to say your pain was always managed very well.  Natalie Marquez L 03/17/2017

## 2017-03-17 NOTE — H&P (Signed)
Natalie Marquez is a 36 y.o. female presenting for SROM at 8 am, clear fluid. Mild contractions, no bleeding, +FMs.  G1- uncomplicated term pregnancy and SVD after 41 wk long IOL.  G2-G4 - 3 SABs in 1st trim. W/up negative except Homozygous for MTHFR mutation and hence on high dose folic acid in preg  This pregnancy overall ok, took prometrium in 1st trim. Folic acid. Leg cellulitis and needing antibiotic at 17 wks. GERD needing Ranitidine  OB History    Gravida Para Term Preterm AB Living   5 1 1   3 1    SAB TAB Ectopic Multiple Live Births   3       1     Past Medical History:  Diagnosis Date  . Anemia   . Cellulitis and abscess of left leg   . GERD (gastroesophageal reflux disease)    Past Surgical History:  Procedure Laterality Date  . APPENDECTOMY    . HERNIA REPAIR    . WISDOM TOOTH EXTRACTION     Family History: family history includes Cancer in her maternal grandmother; Heart disease in her maternal grandfather; Hypertension in her father and mother; Thyroid disease in her paternal grandmother. Social History:  reports that  has never smoked. she has never used smokeless tobacco. She reports that she does not drink alcohol or use drugs.     Maternal Diabetes: No Genetic Screening: Normal Maternal Ultrasounds/Referrals: Normal Fetal Ultrasounds or other Referrals:  None Maternal Substance Abuse:  No Significant Maternal Medications:  Meds include: Progesterone Other:  Ranitidine, Folic acid, Diclegis prn for sleep aid Significant Maternal Lab Results:  Lab values include: Group B Strep negative Other Comments:  None  ROS neg History Dilation: 1.5 Effacement (%): Thick Exam by:: lee Blood pressure 106/60, pulse 78, temperature 98.5 F (36.9 C), temperature source Oral, resp. rate 16. Exam Physical Exam  A&O x 3, no acute distress. Pleasant HEENT neg, no thyromegaly Lungs CTA bilat CV RRR, S1S2 normal Abdo soft, non tender, non acute Extr no edema/  tenderness Pelvic above FHT  140s/ + accels/ no decels/ mod variab- Cat I Toco Irreg but now q 4 min with pitocin   Prenatal labs: ABO, Rh: --/--/O POS (11/27 1245) Antibody: NEG (11/27 1245) Rubella: Immune (11/27 1136) RPR: Nonreactive (11/27 1136)  HBsAg: Negative (11/27 1136)  HIV: Non-reactive (11/27 1136)  GBS: Negative (11/27 1136)   Assessment/Plan: 36 yo, G5P1031, 38.3 wks with SROM at 8 am today. Vtx. Labor admit. Augment with pitocin. GBS(-). FHT - cat I EFW 7.1/2 lbs.  MTHFR homozygous mutation on folic acid high dose. Anticipate SVD Epidural by choice    Natalie FergusonVaishali Marquez Natalie Marquez 03/17/2017, 4:23 PM

## 2017-03-17 NOTE — Anesthesia Procedure Notes (Signed)
Epidural Patient location during procedure: OB Start time: 03/17/2017 4:13 PM End time: 03/17/2017 4:16 PM  Staffing Anesthesiologist: Beryle LatheBrock, Thomas E, MD Performed: anesthesiologist   Preanesthetic Checklist Completed: patient identified, pre-op evaluation, timeout performed, IV checked, risks and benefits discussed and monitors and equipment checked  Epidural Patient position: sitting Prep: DuraPrep Patient monitoring: continuous pulse ox and blood pressure Approach: midline Location: L3-L4 Injection technique: LOR saline  Needle:  Needle type: Tuohy  Needle gauge: 17 G Needle length: 9 cm Needle insertion depth: 6 cm Catheter size: 19 Gauge Catheter at skin depth: 12 cm Test dose: negative and Other (1% lidocaine)  Additional Notes Patient identified. Risks including, but not limited to, bleeding, infection, nerve damage, paralysis, inadequate analgesia, blood pressure changes, nausea, vomiting, allergic reaction, postpartum back pain, itching, and headache were discussed. Patient expressed understanding and wished to proceed. Sterile prep and drape, including hand hygiene, mask, and sterile gloves were used. The patient was positioned and the spine was prepped. The skin was anesthetized with lidocaine. No paraesthesia or other complication noted. The patient did not experience any signs of intravascular injection such as tinnitus or metallic taste in mouth, nor signs of intrathecal spread such as rapid motor block. Please see nursing notes for vital signs. The patient tolerated the procedure well.   Leslye Peerhomas Brock, MDReason for block:procedure for pain

## 2017-03-18 LAB — CBC
HEMATOCRIT: 35.8 % — AB (ref 36.0–46.0)
HEMOGLOBIN: 11.9 g/dL — AB (ref 12.0–15.0)
MCH: 30.1 pg (ref 26.0–34.0)
MCHC: 33.2 g/dL (ref 30.0–36.0)
MCV: 90.4 fL (ref 78.0–100.0)
Platelets: 212 10*3/uL (ref 150–400)
RBC: 3.96 MIL/uL (ref 3.87–5.11)
RDW: 15.3 % (ref 11.5–15.5)
WBC: 13.8 10*3/uL — ABNORMAL HIGH (ref 4.0–10.5)

## 2017-03-18 LAB — RPR: RPR Ser Ql: NONREACTIVE

## 2017-03-18 NOTE — Anesthesia Postprocedure Evaluation (Signed)
Anesthesia Post Note  Patient: [name]  Procedure(s) Performed: AN AD HOC LABOR EPIDURAL     Patient location during evaluation: Mother Baby Anesthesia Type: Epidural Level of consciousness: awake, awake and alert, oriented and patient cooperative Pain management: pain level controlled Vital Signs Assessment: post-procedure vital signs reviewed and stable Respiratory status: spontaneous breathing, nonlabored ventilation and respiratory function stable Cardiovascular status: stable Postop Assessment: no headache, no backache, patient able to bend at knees and no apparent nausea or vomiting Anesthetic complications: no    Last Vitals:  Vitals:   03/18/17 0500 03/18/17 1200  BP: (!) 103/57 114/60  Pulse: 77 87  Resp: 18 18  Temp: 36.8 C 36.7 C  SpO2: 98%     Last Pain:  Vitals:   03/18/17 1200  TempSrc: Oral  PainSc:    Pain Goal:                 [name] L

## 2017-03-18 NOTE — Progress Notes (Signed)
PPD #1, SVD, 1st degree repair, baby girl "Natalie Marquez"  S:  Reports feeling good, some soreness, but tolerable              Tolerating po/ No nausea or vomiting / Denies dizziness or SOB             Bleeding is moderate             Pain controlled with Motrin             Up ad lib / ambulatory / voiding QS  Newborn breast feeding - going well per patient, may supplement with formula  O:               VS: BP 114/60 (BP Location: Left Arm)   Pulse 87   Temp 98.1 F (36.7 C) (Oral)   Resp 18   Ht 5\' 3"  (1.6 m)   Wt 73 kg (161 lb)   SpO2 98%   Breastfeeding? Unknown   BMI 28.52 kg/m    LABS:              Recent Labs    03/17/17 1245 03/18/17 0508  WBC 10.6* 13.8*  HGB 11.2* 11.9*  PLT 228 212               Blood type: --/--/O POS (11/27 1245)  Rubella: Immune (11/27 1136)                     I&O: Intake/Output      11/27 0701 - 11/28 0700 11/28 0701 - 11/29 0700   Urine (mL/kg/hr) 300    Blood 250    Total Output 550    Net -550         Urine Occurrence  1 x                 Physical Exam:             Alert and oriented X3  Lungs: Clear and unlabored  Heart: regular rate and rhythm / no murmurs  Abdomen: soft, non-tender, non-distended              Fundus: firm, non-tender, U-1  Perineum: well approximated 1st degree laceration, healing well, no significant edema or erythema   Lochia: appropriate, no clots  Extremities: +1 BLE edema, no calf pain or tenderness    A: PPD # 1, SVD  1st degree repair    MTHFR homozygous mutation   Doing well - stable status  P: Routine post partum orders  May shower today   Ambulation encouraged  Anticipate discharge home tomorrow   Carlean JewsMeredith Yovani Cogburn, MSN, CNM [facility]

## 2017-03-18 NOTE — Lactation Note (Signed)
This note was copied from a baby's chart. Lactation Consultation Note Baby 6 hrs old. Has BF to Rt. Breast. Unable to latch to Lt. Breast. Mom stated nipple to large. Baby latched to Rt. Nipple in cradle position. When unlatched, nipple pinched. Explained to mom baby didn't have a good latch. Mom stated her nipple are to big. Her daughter didn't like the breast because of her nipples.  Mom's 1st child now 35 yrs old, BF for 1 months, breast/formula feeding. Then mom pumped for 7 months and gave BM/formula in bottle. Didn't go to breast. Mom stated she returned to work after 7 months so she stopped pumping. Mom stated she had milk to cont. To leak for 3 yrs. She is hoping that doesn't happen again. Moms breast are heavy. Hand expressed colostrum. Collected 5 ml from Lt. Breast while BF on Rt. Encouraged mom to wake baby if hasn't cued in 3 hrs.  Encouraged mom to roll nipples in finger tips to firm up and get smaller for latching. Baby on breast BF at intervals. Discussed BF position options. Mom BF in cradle position, stated she has hurt shoulder 1 yr ago, had surgery. LC propped pillow under arm. Taught football hold as well. Encouraged STS, I&O, and post pumping to give supplement as needed. Gave hand pump, flange 24. Encouraged mom if to tight, ask for larger size. Appears to be OK, but told mom as pumps nipples get large and could get to tight. Has #27 flanges in DEBP kit, may need that. Mom stated she always used hand pump w/her  Daughter, she didn't use a DEBP. Mom has WIC. DEBP kit in rm, going to set up KIT but mom stated she was tired after hand pump given. DEBP at bedside as well as kit.  Discussed newborn feeding habits, STS, cluster feeding, I&O, supply and demand. Mom encouraged to feed baby 8-12 times/24 hours and with feeding cues.  WH/LC brochure given w/resources, support groups and LC services.  Patient Name: Natalie Marquez Today's Date: 03/18/2017 Reason for consult: Initial  assessment   Maternal Data Has patient been taught Hand Expression?: Yes Does the patient have breastfeeding experience prior to this delivery?: Yes  Feeding Feeding Type: Breast Milk Length of feed: 15 min  LATCH Score Latch: Repeated attempts needed to sustain latch, nipple held in mouth throughout feeding, stimulation needed to elicit sucking reflex.  Audible Swallowing: A few with stimulation  Type of Nipple: Everted at rest and after stimulation  Comfort (Breast/Nipple): Soft / non-tender  Hold (Positioning): Assistance needed to correctly position infant at breast and maintain latch.  LATCH Score: 7  Interventions Interventions: Breast feeding basics reviewed;Assisted with latch;Breast compression;Skin to skin;Adjust position;Support pillows;Hand pump;Breast massage;Hand express;Position options;Expressed milk  Lactation Tools Discussed/Used Tools: Pump Breast pump type: Manual(has DEBP kit in rm to use. needs teaching mom tired) WIC Program: Yes Pump Review: Milk Storage;Setup, frequency, and cleaning Initiated by:: L. Carver RN IBCLC Date initiated:: 03/18/17   Consult Status Consult Status: Follow-up Date: 03/18/17 Follow-up type: In-patient    CARVER, LAURA G 03/18/2017, 3:18 AM    

## 2017-03-19 ENCOUNTER — Ambulatory Visit: Payer: Self-pay

## 2017-03-19 ENCOUNTER — Other Ambulatory Visit: Payer: Self-pay

## 2017-03-19 MED ORDER — IBUPROFEN 600 MG PO TABS: 600.0000 mg | ORAL_TABLET | Freq: Four times a day (QID) | ORAL | 0 refills | Status: DC

## 2017-03-19 NOTE — Progress Notes (Signed)
PPD #2, SVD with 1st degree repair, baby girl "MaltaSofia"  S:  Reports feeling better today and ready to go home             Tolerating po/ No nausea or vomiting / Denies dizziness or SOB             Bleeding is moderate             Pain controlled with Motrin             Up ad lib / ambulatory / voiding QS  Newborn breast feeding - going better today   O:               VS: BP 105/61 (BP Location: Right Arm)   Pulse 72   Temp 98.2 F (36.8 C) (Oral)   Resp 16   Ht 5\' 3"  (1.6 m)   Wt 73 kg (161 lb)   SpO2 98%   Breastfeeding? Unknown   BMI 28.52 kg/m    LABS:              Recent Labs    03/17/17 1245 03/18/17 0508  WBC 10.6* 13.8*  HGB 11.2* 11.9*  PLT 228 212               Blood type: --/--/O POS (11/27 1245)  Rubella: Immune (11/27 1136)                                Physical Exam:             Alert and oriented X3  Lungs: Clear and unlabored  Heart: regular rate and rhythm / no murmurs  Abdomen: soft, non-tender, non-distended              Fundus: firm, non-tender, U-1  Perineum: well approximated 1st degree laceration, healing well, no significant erythema or edema  Lochia: appropriate, no clots  Extremities: +1 BLE edema, no calf pain or tenderness    A: PPD # 2, SVD             1st degree repair               MTHFR homozygous mutation              Doing well - stable status  P: Routine post partum orders  Discharge home  WOB discharge book and instructions given  F/u in 6 weeks with Dr. Ernestina PennaFogleman for PP visit   Carlean JewsMeredith Barnie Sopko, MSN, [facility]

## 2017-03-19 NOTE — Lactation Note (Signed)
This note was copied from a baby's chart. Lactation Consultation Note: Mother reports that infant is breastfeeding well. Mother has an electric pump at home. She was advised to continue to post pump and supplement infant according to guidelines.  Mothers breast are filling. She reports that her rt nipple is slightly sore. No observed crack, only slightly pink in the center of nipple. Mother hand expressed large amts of milk and applied to nipple. Mother advised in treatment and prevention of engorgement. Mother is active with WIC. Mother has Peds appt in a.m. For wt check. Mother is aware of available LC services and community support.   Patient Name: Natalie Marquez EXBMW'UToday's Date: 03/19/2017 Reason for consult: Follow-up assessment   Maternal Data    Feeding    LATCH Score                   Interventions    Lactation Tools Discussed/Used     Consult Status Consult Status: Complete    Michel BickersKendrick, Deniqua Perry McCoy 03/19/2017, 3:55 PM

## 2017-03-19 NOTE — Discharge Summary (Signed)
Obstetric Discharge Summary   Patient Name: Natalie Marquez DOB: [birth year] MRN: [id number]  Date of Admission: 03/17/2017 Date of Discharge: 03/19/2017 Date of Delivery: 03/17/17 Gestational Age at Delivery: 4933w3d  Primary OB: [facility] - Dr. Ernestina PennaFogleman   Antepartum complications:  - MTHFR homozygous mutation  - GBS Negative - hx. Of recurrent SABs - Leg Cellulitis at 17 weeks - s/p antibiotic  - GERD Prenatal Labs:  ABO, Rh: --/--/O POS (11/27 1245) Antibody: NEG (11/27 1245) Rubella: Immune (11/27 1136) RPR: Nonreactive (11/27 1136)  HBsAg: Negative (11/27 1136)  HIV: Non-reactive (11/27 1136)  GBS: Negative (11/27 1136)   Admitting Diagnosis: SROM at term   Secondary Diagnoses: Patient Active Problem List   Diagnosis Date Noted  . Postpartum care following vaginal delivery (11/27) 03/18/2017  . First degree perineal laceration during delivery 03/18/2017  . Pregnancy 03/17/2017  . SVD (spontaneous vaginal delivery) 03/17/2017  . MTHFR deficiency complicating pregnancy (HCC) 12/27/2016  . Recent shoulder injury 03/17/2016  . Physical exam 10/24/2014    Augmentation: Pitocin Complications: None  Date of Delivery: 03/17/17 Delivered By: Dr. Juliene PinaMody Delivery Type: spontaneous vaginal delivery Anesthesia: epidural Placenta: sponatneous Laceration: 1st degree Episiotomy: none  Newborn Data: Live born female  Birth Weight: 6 lb 2.6 oz (2795 g) APGAR: 9, 9  Newborn Delivery   Birth date/time:  03/17/2017 21:11:00 Delivery type:  Vaginal, Spontaneous     Postpartum Course  (Vaginal Delivery): Patient had an uncomplicated postpartum course.  By time of discharge on PPD#2, her pain was controlled on oral pain medications; she had appropriate lochia and was ambulating, voiding without difficulty and tolerating regular diet.  She was deemed stable for discharge to home.      Labs: CBC Latest Ref Rng & Units 03/18/2017 03/17/2017 12/27/2016  WBC 4.0 - 10.5 K/uL  13.8(H) 10.6(H) 18.8(H)  Hemoglobin 12.0 - 15.0 g/dL 11.9(L) 11.2(L) 10.4(L)  Hematocrit 36.0 - 46.0 % 35.8(L) 33.7(L) 30.1(L)  Platelets 150 - 400 K/uL 212 228 206   O POS  Physical exam:  BP 105/61 (BP Location: Right Arm)   Pulse 72   Temp 98.2 F (36.8 C) (Oral)   Resp 16   Ht 5\' 3"  (1.6 m)   Wt 73 kg (161 lb)   SpO2 98%   Breastfeeding? Unknown   BMI 28.52 kg/m  General: alert and no distress Pulm: normal respiratory effort Lochia: appropriate Abdomen: soft, NT Uterine Fundus: firm, below umbilicus Perineum: healing well, no significant erythema, no significant edema Extremities: No evidence of DVT seen on physical exam. +1 BLE lower extremity edema.   Disposition: stable, discharge to home Baby Feeding: breast milk Baby Disposition: home with mom  Contraception: unsure, but planning  Rh Immune globulin given: N/A Rubella vaccine given: N/A Tdap vaccine given in AP or PP setting: UTD Flu vaccine given in AP or PP setting: UTD   Plan:  Natalie Marquez was discharged to home in good condition. [facility]  Discharge Instructions: Per After Visit Summary. Activity: Advance as tolerated. Pelvic rest for 6 weeks.  Refer to After Visit Summary Diet: Regular, Heart Healthy Discharge Medications: Allergies as of 03/19/2017      Reactions   Penicillins Rash   Has patient had a PCN reaction causing immediate rash, facial/tongue/throat swelling, SOB or lightheadedness with hypotension: Yes Has patient had a PCN reaction causing severe rash involving mucus membranes or skin necrosis: Yes Has patient had a PCN reaction that required hospitalization: No Has patient  had a PCN reaction occurring within the last 10 years: No If all of the above answers are "NO", then may proceed with Cephalosporin use.      Medication List    STOP taking these medications   aspirin EC 81 MG tablet   azithromycin 250 MG tablet Commonly known  as:  ZITHROMAX   cephALEXin 500 MG capsule Commonly known as:  KEFLEX   DICLEGIS 10-10 MG Tbec Generic drug:  Doxylamine-Pyridoxine   FERRALET 90 PO   folic acid 1 MG tablet Commonly known as:  FOLVITE   ranitidine 150 MG tablet Commonly known as:  ZANTAC     TAKE these medications   guaifenesin 100 MG/5ML syrup Commonly known as:  ROBITUSSIN Take 200 mg by mouth 3 (three) times daily as needed for cough.   ibuprofen 600 MG tablet Commonly known as:  ADVIL,MOTRIN Take 1 tablet (600 mg total) by mouth every 6 (six) hours.   prenatal multivitamin Tabs tablet Take 1 tablet by mouth daily at 12 noon.      Outpatient follow up:  Follow-up Information    Noland FordyceFogleman, Kelly, MD. Schedule an appointment as soon as possible for a visit in 6 week(s).   Specialty:  Obstetrics and Gynecology Why:  Postpartum visit  Contact information: [address] [place] [id number] [phone]           Signed:  Carlean JewsMeredith Khye Hochstetler, MSN, CNM [facility]

## 2017-03-19 NOTE — Lactation Note (Signed)
This note was copied from a baby's chart. Lactation Consultation Note Baby 6927 hrs old. Baby sleeping in football position against moms breast. Mom stated if the baby is moved from breast, she will cry. Mom stated baby feeding then rest, then feeds more. Reminded of the cluster feeding.  Mom states she is unable to BF to Lt. Breast. Mom has pumped that breast. #24 NS fitted. Mom stated she had one but can't find it. Encouraged to BF w/NS and offer that breast first. Mom stated she had to use a NS w/her oldest daughter. LC gave shells to wear in am to help narrow the base of nipple for latching w/o NS.  Encouraged not to let Lt. Breast get full, if baby doesn't BF then she needs to pump.  Encouraged to call for assistance in latching Lt. Breast w/NS.   Patient Name: Natalie Marquez ZOXWR'UToday's Date: 03/19/2017 Reason for consult: Follow-up assessment   Maternal Data    Feeding Feeding Type: Breast Milk Length of feed: (few sucks)  LATCH Score Latch: Too sleepy or reluctant, no latch achieved, no sucking elicited.  Audible Swallowing: None  Type of Nipple: Everted at rest and after stimulation  Comfort (Breast/Nipple): Soft / non-tender  Hold (Positioning): Assistance needed to correctly position infant at breast and maintain latch.  LATCH Score: 5  Interventions Interventions: Breast feeding basics reviewed;Assisted with latch;Skin to skin;Hand express;Support pillows;Position options;Expressed milk;DEBP  Lactation Tools Discussed/Used Tools: Pump;Flanges;Nipple Shields Nipple shield size: 24 Flange Size: 27 Breast pump type: Double-Electric Breast Pump   Consult Status Consult Status: Follow-up Date: 03/19/17(in pm) Follow-up type: In-patient    Addisynn Vassell, Diamond NickelLAURA G 03/19/2017, 1:09 AM

## 2017-06-04 DIAGNOSIS — K5900 Constipation, unspecified: Secondary | ICD-10-CM | POA: Diagnosis not present

## 2017-06-04 DIAGNOSIS — R6889 Other general symptoms and signs: Secondary | ICD-10-CM | POA: Diagnosis not present

## 2017-10-11 IMAGING — CR DG SHOULDER 2+V*R*
3 series · 3 of 3 positions shown · non-contrast
Comparison: None.

CLINICAL DATA: Right shoulder injury. Fell 4 days ago. Right
shoulder pain with movement. Initial encounter.

EXAM:
RIGHT SHOULDER - 2+ VIEW

[w shoulder ap internal righ]
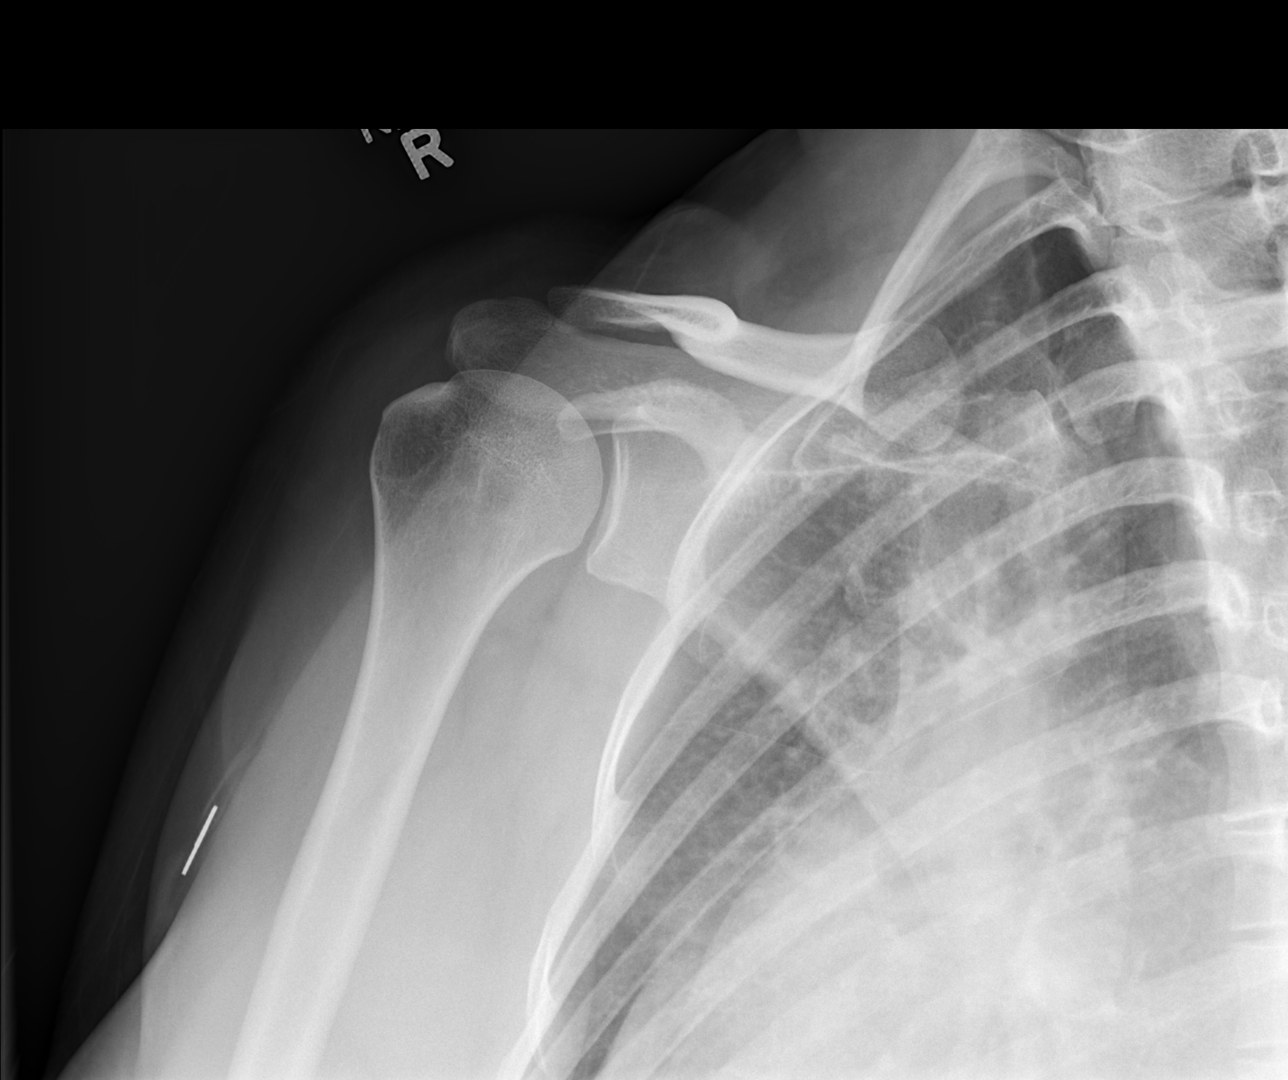

[w shoulder y view right]
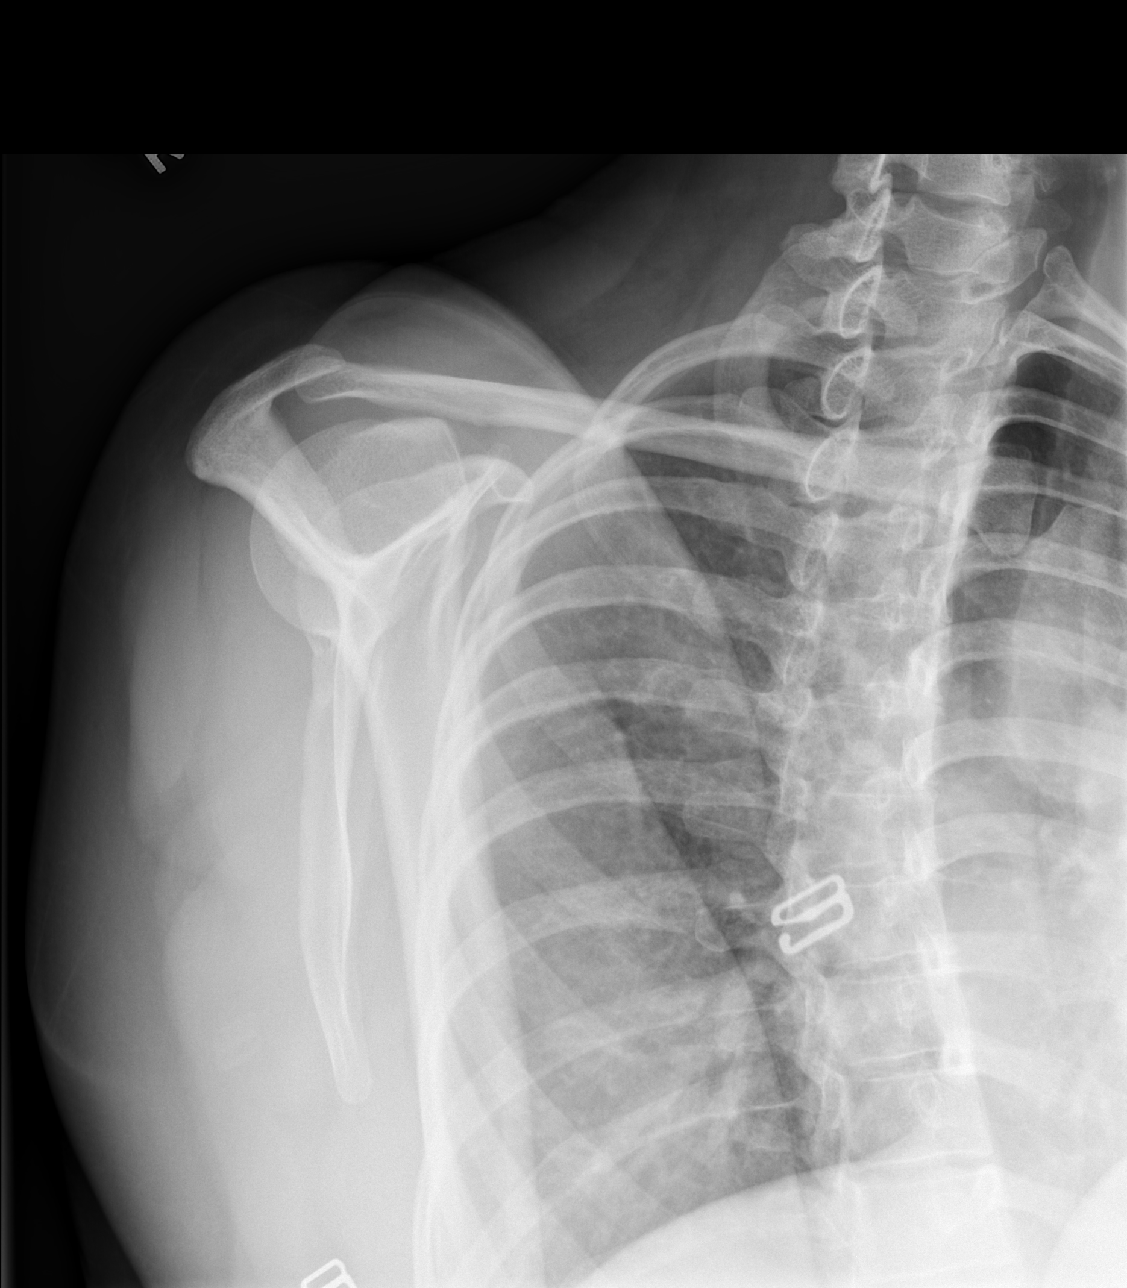

[w shoulder axillary right *]
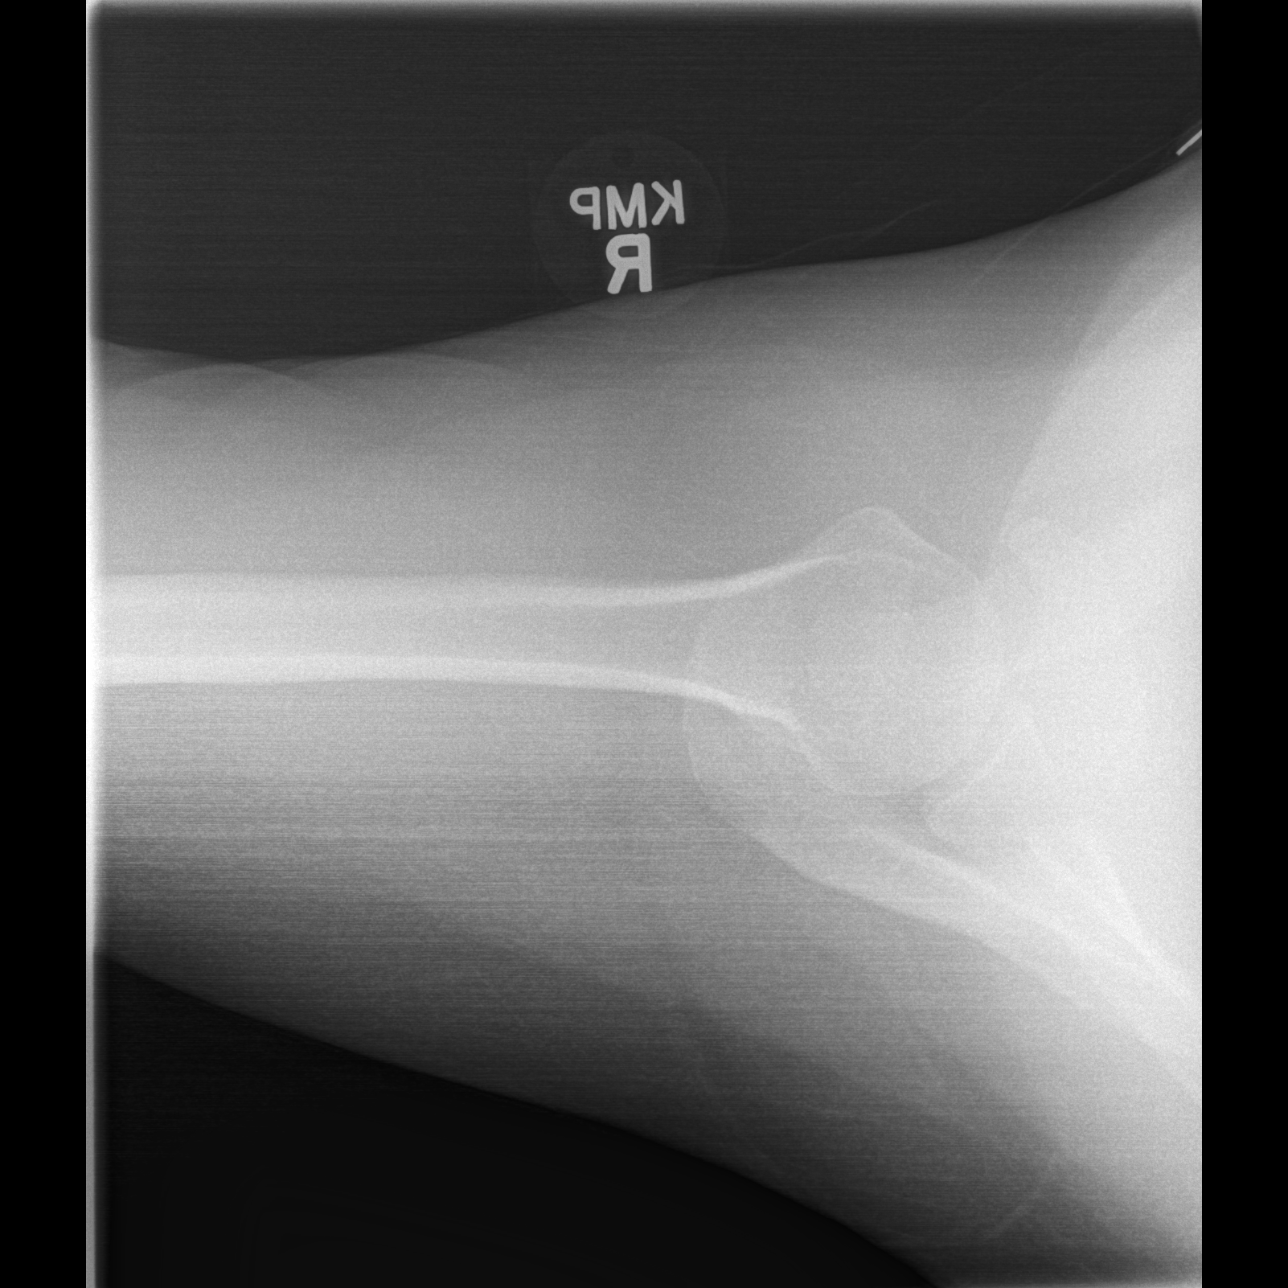

[3 of 3 positions shown; findings below may reference images not displayed]

FINDINGS: There is no evidence of fracture or dislocation. There is no
evidence of arthropathy or other focal bone abnormality. Soft
tissues are unremarkable.
IMPRESSION: Negative.

## 2018-01-30 DIAGNOSIS — Z23 Encounter for immunization: Secondary | ICD-10-CM | POA: Diagnosis not present

## 2018-07-23 IMAGING — CR DG CHEST 2V
2 series · 2 of 2 positions shown · non-contrast
Comparison: None.

CLINICAL DATA: Shortness of breath.  Fever.

EXAM:
CHEST  2 VIEW

[chest pa]
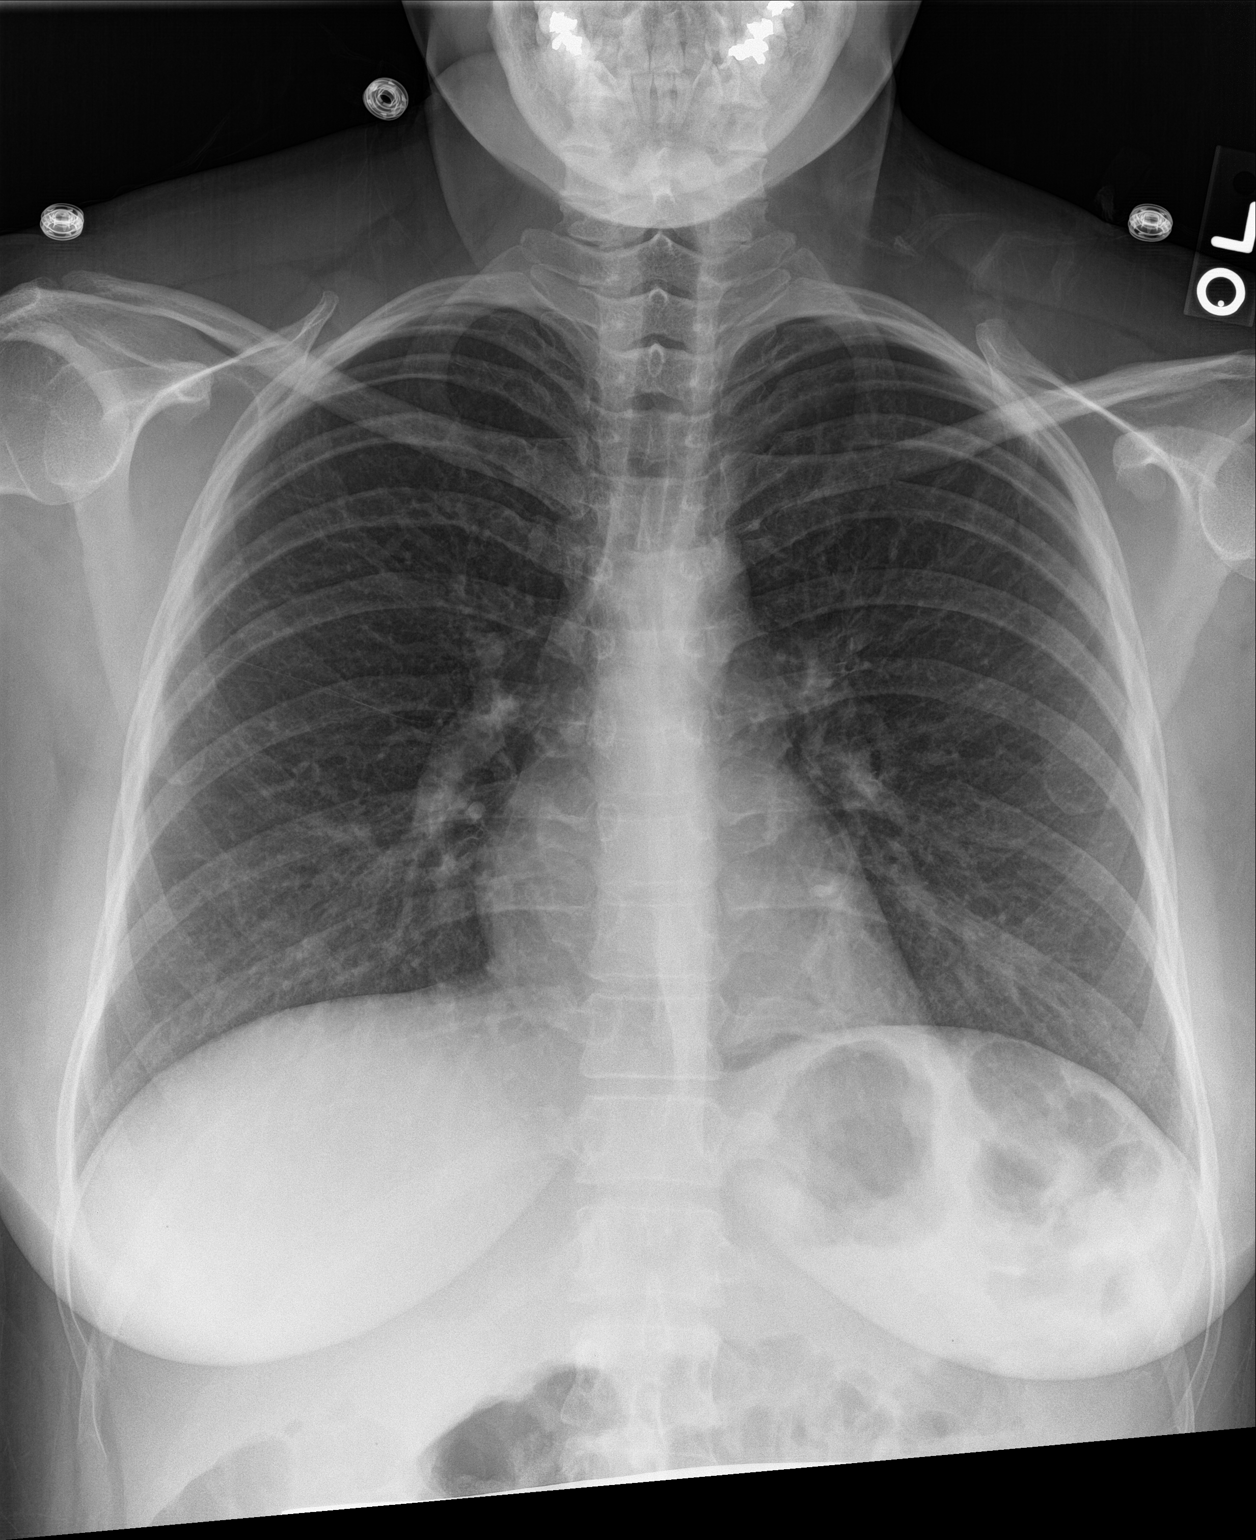

[chest lat]
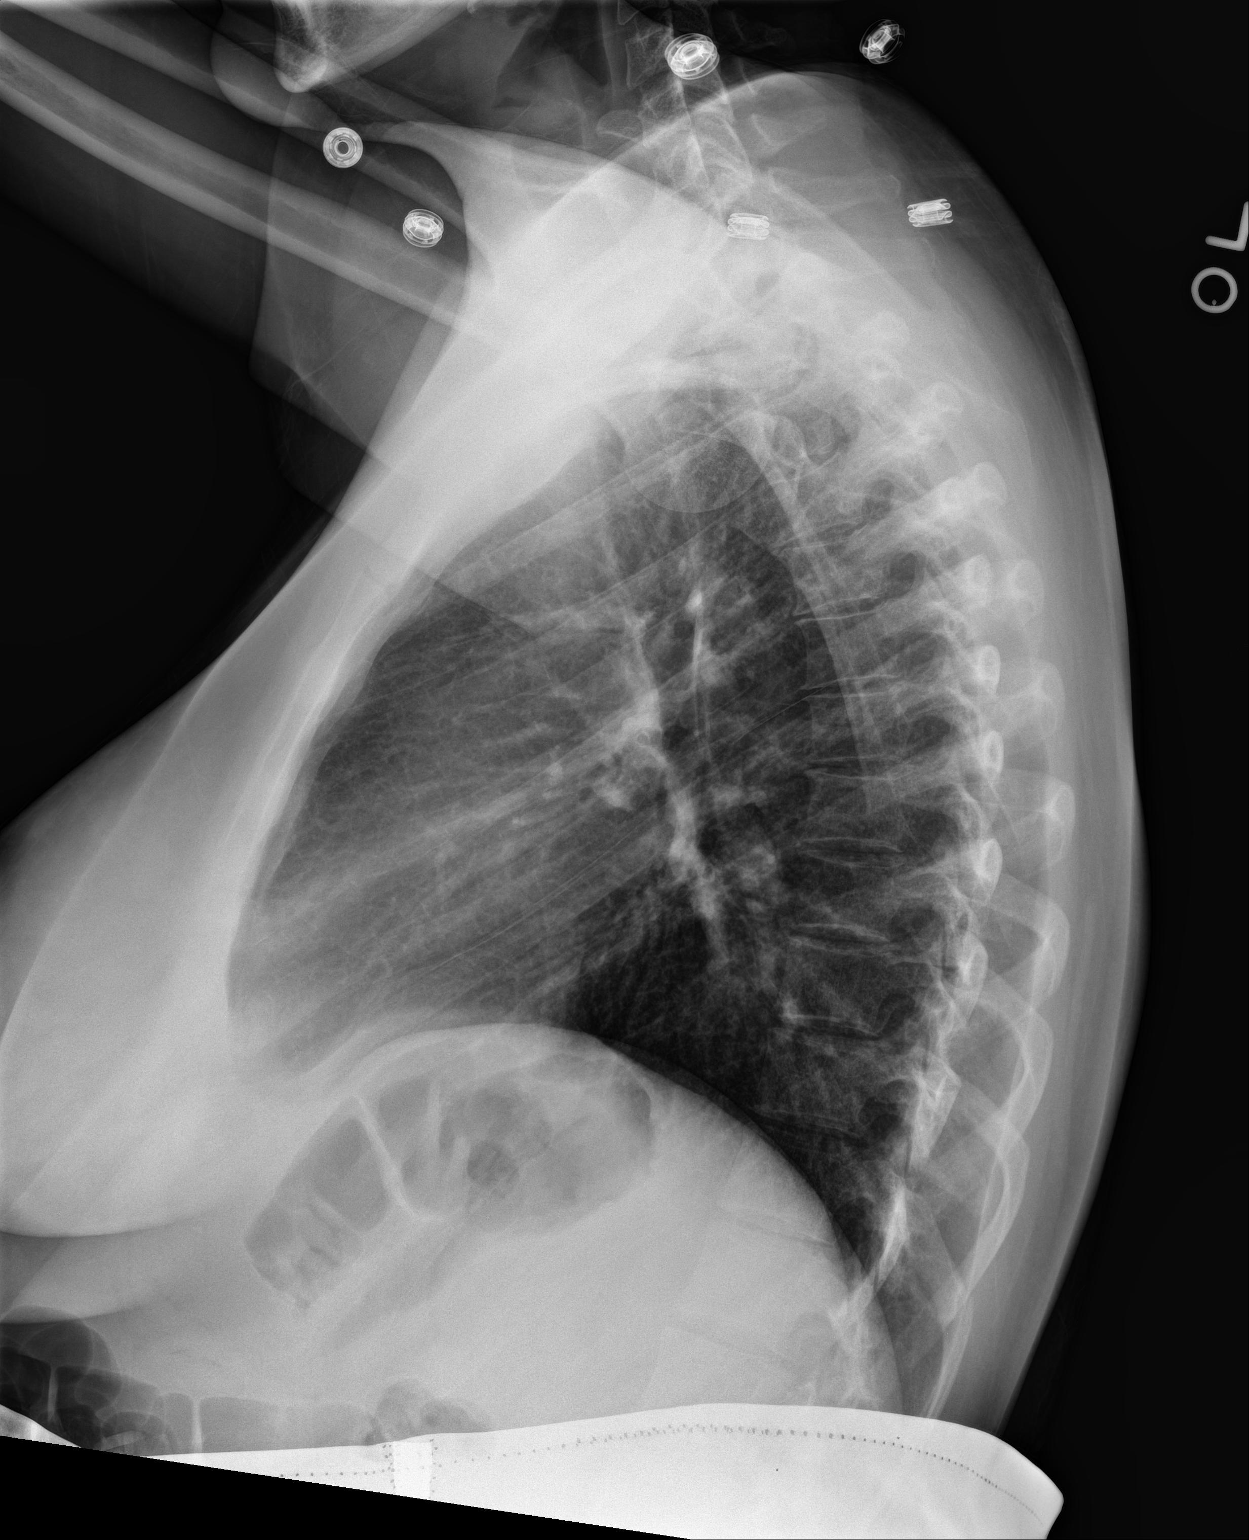

[2 of 2 positions shown; findings below may reference images not displayed]

FINDINGS: The heart size and mediastinal contours are within normal limits.
Both lungs are clear. The visualized skeletal structures are
unremarkable.
IMPRESSION: No active cardiopulmonary disease.

## 2019-01-11 ENCOUNTER — Encounter: Payer: 59 | Admitting: Family Medicine

## 2019-01-17 ENCOUNTER — Telehealth: Payer: Self-pay

## 2019-01-17 NOTE — Telephone Encounter (Signed)
Patient does not belong to me so will defer to PCP. However, she has not been seen in almost 3 years and was not seen related to [place]. As such we typically will not provide note for this. She would need to contact who tested her for COVID to get a note.

## 2019-01-17 NOTE — Telephone Encounter (Signed)
Patient called in stating that she has tested positive for COVID and is needing a note for work.   Patient's last visit with Dr. Birdie Riddle 7.5.16 and visit with [facility] on 11.27.17. Please advise

## 2019-01-18 NOTE — Telephone Encounter (Signed)
Called pt and left a detailed message to advise that she would need a virtual visit to discuss.

## 2019-01-18 NOTE — Telephone Encounter (Signed)
Since I haven't seen pt in over 4 yrs she would need a virtual visit to discuss her dx, her sxs, and the amount of time needed out of work

## 2019-01-18 NOTE — Telephone Encounter (Signed)
LMOVM to return call to schedule VV appt with Dr. Birdie Riddle and needing new insurance information

## 2022-10-20 ENCOUNTER — Ambulatory Visit (HOSPITAL_BASED_OUTPATIENT_CLINIC_OR_DEPARTMENT_OTHER): Payer: Medicaid Other | Admitting: Physical Therapy

## 2022-12-31 ENCOUNTER — Ambulatory Visit: Payer: Self-pay | Admitting: Family Medicine

## 2023-03-06 ENCOUNTER — Encounter (HOSPITAL_BASED_OUTPATIENT_CLINIC_OR_DEPARTMENT_OTHER): Payer: Self-pay | Admitting: Orthopedic Surgery

## 2023-03-06 ENCOUNTER — Other Ambulatory Visit (HOSPITAL_COMMUNITY): Payer: Self-pay | Admitting: Orthopedic Surgery

## 2023-03-06 ENCOUNTER — Other Ambulatory Visit: Payer: Self-pay

## 2023-03-11 NOTE — Anesthesia Preprocedure Evaluation (Signed)
Anesthesia Evaluation  Patient identified by MRN, date of birth, ID band Patient awake    Reviewed: Allergy & Precautions, NPO status , Patient's Chart, lab work & pertinent test results  History of Anesthesia Complications Negative for: history of anesthetic complications  Airway Mallampati: III  TM Distance: >3 FB Neck ROM: Full    Dental  (+) Dental Advisory Given   Pulmonary neg shortness of breath, asthma (allergy-induced, no recent inhaler use) , neg sleep apnea, neg COPD, neg recent URI   Pulmonary exam normal breath sounds clear to auscultation       Cardiovascular negative cardio ROS  Rhythm:Regular Rate:Normal     Neuro/Psych negative neurological ROS     GI/Hepatic Neg liver ROS,GERD  ,,  Endo/Other  negative endocrine ROS    Renal/GU negative Renal ROS     Musculoskeletal   Abdominal  (+) + obese  Peds  Hematology  (+) Blood dyscrasia, anemia   Anesthesia Other Findings   Reproductive/Obstetrics                              Anesthesia Physical Anesthesia Plan  ASA: 2  Anesthesia Plan: General   Post-op Pain Management: Regional block* and Tylenol PO (pre-op)*   Induction: Intravenous  PONV Risk Score and Plan: 3 and Dexamethasone, Ondansetron and Treatment may vary due to age or medical condition  Airway Management Planned: LMA  Additional Equipment:   Intra-op Plan:   Post-operative Plan: Extubation in OR  Informed Consent: I have reviewed the patients History and Physical, chart, labs and discussed the procedure including the risks, benefits and alternatives for the proposed anesthesia with the patient or authorized representative who has indicated his/her understanding and acceptance.     Dental advisory given  Plan Discussed with: CRNA and Anesthesiologist  Anesthesia Plan Comments: (Discussed potential risks of nerve blocks including, but not limited  to, infection, bleeding, nerve damage, seizures, pneumothorax, respiratory depression, and potential failure of the block. Alternatives to nerve blocks discussed. All questions answered.  Risks of general anesthesia discussed including, but not limited to, sore throat, hoarse voice, chipped/damaged teeth, injury to vocal cords, nausea and vomiting, allergic reactions, lung infection, heart attack, stroke, and death. All questions answered. )         Anesthesia Quick Evaluation

## 2023-03-12 ENCOUNTER — Ambulatory Visit (HOSPITAL_BASED_OUTPATIENT_CLINIC_OR_DEPARTMENT_OTHER)
Admission: RE | Admit: 2023-03-12 | Discharge: 2023-03-12 | Disposition: A | Discharge status: Home / Self Care | Payer: PRIVATE HEALTH INSURANCE | Attending: Orthopedic Surgery | Admitting: Orthopedic Surgery

## 2023-03-12 ENCOUNTER — Ambulatory Visit (HOSPITAL_BASED_OUTPATIENT_CLINIC_OR_DEPARTMENT_OTHER): Payer: PRIVATE HEALTH INSURANCE | Admitting: Anesthesiology

## 2023-03-12 ENCOUNTER — Ambulatory Visit (HOSPITAL_BASED_OUTPATIENT_CLINIC_OR_DEPARTMENT_OTHER): Payer: Self-pay | Admitting: Anesthesiology

## 2023-03-12 ENCOUNTER — Other Ambulatory Visit: Payer: Self-pay

## 2023-03-12 ENCOUNTER — Encounter (HOSPITAL_BASED_OUTPATIENT_CLINIC_OR_DEPARTMENT_OTHER): Payer: Self-pay | Admitting: Orthopedic Surgery

## 2023-03-12 ENCOUNTER — Encounter (HOSPITAL_BASED_OUTPATIENT_CLINIC_OR_DEPARTMENT_OTHER)
Admission: RE | Disposition: A | Discharge status: Home / Self Care | Payer: Self-pay | Source: Home / Self Care | Attending: Orthopedic Surgery

## 2023-03-12 DIAGNOSIS — J45909 Unspecified asthma, uncomplicated: Secondary | ICD-10-CM | POA: Insufficient documentation

## 2023-03-12 DIAGNOSIS — D649 Anemia, unspecified: Secondary | ICD-10-CM | POA: Diagnosis not present

## 2023-03-12 DIAGNOSIS — K219 Gastro-esophageal reflux disease without esophagitis: Secondary | ICD-10-CM | POA: Insufficient documentation

## 2023-03-12 DIAGNOSIS — E669 Obesity, unspecified: Secondary | ICD-10-CM | POA: Diagnosis not present

## 2023-03-12 DIAGNOSIS — M6701 Short Achilles tendon (acquired), right ankle: Secondary | ICD-10-CM | POA: Diagnosis present

## 2023-03-12 DIAGNOSIS — M722 Plantar fascial fibromatosis: Secondary | ICD-10-CM | POA: Insufficient documentation

## 2023-03-12 DIAGNOSIS — Z01818 Encounter for other preprocedural examination: Secondary | ICD-10-CM

## 2023-03-12 DIAGNOSIS — Z793 Long term (current) use of hormonal contraceptives: Secondary | ICD-10-CM | POA: Diagnosis not present

## 2023-03-12 DIAGNOSIS — Z6831 Body mass index (BMI) 31.0-31.9, adult: Secondary | ICD-10-CM | POA: Diagnosis not present

## 2023-03-12 HISTORY — PX: GASTROCNEMIUS RECESSION: SHX863

## 2023-03-12 HISTORY — PX: PLANTAR FASCIA RELEASE: SHX2239

## 2023-03-12 HISTORY — DX: Plantar fascial fibromatosis: M72.2

## 2023-03-12 HISTORY — DX: Unspecified asthma, uncomplicated: J45.909

## 2023-03-12 LAB — POCT PREGNANCY, URINE: Preg Test, Ur: NEGATIVE

## 2023-03-12 SURGERY — RECESSION, MUSCLE, GASTROCNEMIUS
Anesthesia: General | Site: Leg Lower | Laterality: Right

## 2023-03-12 MED ORDER — BUPIVACAINE HCL (PF) 0.25 % IJ SOLN
INTRAMUSCULAR | Status: DC | PRN
  Administered 2023-03-12: 20 mL via PERINEURAL

## 2023-03-12 MED ORDER — DOCUSATE SODIUM 100 MG PO CAPS: 100.0000 mg | ORAL_CAPSULE | Freq: Two times a day (BID) | ORAL | 0 refills | Status: AC

## 2023-03-12 MED ORDER — OXYCODONE HCL 5 MG PO TABS: 5.0000 mg | ORAL_TABLET | Freq: Four times a day (QID) | ORAL | 0 refills | Status: AC | PRN

## 2023-03-12 MED ORDER — CEFAZOLIN SODIUM-DEXTROSE 2-4 GM/100ML-% IV SOLN
INTRAVENOUS | Status: AC
  Filled 2023-03-12: qty 100

## 2023-03-12 MED ORDER — PROPOFOL 10 MG/ML IV BOLUS
INTRAVENOUS | Status: DC | PRN
  Administered 2023-03-12: 200 mg via INTRAVENOUS

## 2023-03-12 MED ORDER — FENTANYL CITRATE (PF) 100 MCG/2ML IJ SOLN
INTRAMUSCULAR | Status: AC
  Filled 2023-03-12: qty 2

## 2023-03-12 MED ORDER — BUPIVACAINE LIPOSOME 1.3 % IJ SUSP
INTRAMUSCULAR | Status: DC | PRN
  Administered 2023-03-12: 10 mL via PERINEURAL

## 2023-03-12 MED ORDER — SENNA 8.6 MG PO TABS
2.0000 | ORAL_TABLET | Freq: Two times a day (BID) | ORAL | 0 refills | Status: AC
Start: 2023-03-12 — End: ?

## 2023-03-12 MED ORDER — FENTANYL CITRATE (PF) 100 MCG/2ML IJ SOLN
100.0000 ug | Freq: Once | INTRAMUSCULAR | Status: AC
  Administered 2023-03-12: 50 ug via INTRAVENOUS

## 2023-03-12 MED ORDER — ONDANSETRON HCL 4 MG/2ML IJ SOLN
INTRAMUSCULAR | Status: AC
  Filled 2023-03-12: qty 2

## 2023-03-12 MED ORDER — VANCOMYCIN HCL 500 MG IV SOLR
INTRAVENOUS | Status: AC
Start: 2023-03-12 — End: ?
  Filled 2023-03-12: qty 10

## 2023-03-12 MED ORDER — DEXAMETHASONE SODIUM PHOSPHATE 4 MG/ML IJ SOLN
INTRAMUSCULAR | Status: DC | PRN
  Administered 2023-03-12: 5 mg via INTRAVENOUS

## 2023-03-12 MED ORDER — MIDAZOLAM HCL 2 MG/2ML IJ SOLN
INTRAMUSCULAR | Status: AC
  Filled 2023-03-12: qty 2

## 2023-03-12 MED ORDER — KETOROLAC TROMETHAMINE 30 MG/ML IJ SOLN
INTRAMUSCULAR | Status: DC | PRN
  Administered 2023-03-12: 30 mg via INTRAVENOUS

## 2023-03-12 MED ORDER — CEFAZOLIN SODIUM-DEXTROSE 2-4 GM/100ML-% IV SOLN
2.0000 g | INTRAVENOUS | Status: AC
  Administered 2023-03-12: 2 g via INTRAVENOUS

## 2023-03-12 MED ORDER — MIDAZOLAM HCL 5 MG/5ML IJ SOLN
INTRAMUSCULAR | Status: DC | PRN
  Administered 2023-03-12: 1 mg via INTRAVENOUS

## 2023-03-12 MED ORDER — VANCOMYCIN HCL 1000 MG IV SOLR
INTRAVENOUS | Status: AC
  Filled 2023-03-12: qty 20

## 2023-03-12 MED ORDER — BUPIVACAINE HCL (PF) 0.5 % IJ SOLN
INTRAMUSCULAR | Status: DC | PRN
  Administered 2023-03-12: 10 mL via PERINEURAL

## 2023-03-12 MED ORDER — FENTANYL CITRATE (PF) 100 MCG/2ML IJ SOLN
INTRAMUSCULAR | Status: DC | PRN
  Administered 2023-03-12: 50 ug via INTRAVENOUS

## 2023-03-12 MED ORDER — RIVAROXABAN 10 MG PO TABS
10.0000 mg | ORAL_TABLET | Freq: Every day | ORAL | 0 refills | Status: AC
Start: 2023-03-12 — End: ?

## 2023-03-12 MED ORDER — KETOROLAC TROMETHAMINE 30 MG/ML IJ SOLN
INTRAMUSCULAR | Status: AC
  Filled 2023-03-12: qty 1

## 2023-03-12 MED ORDER — 0.9 % SODIUM CHLORIDE (POUR BTL) OPTIME
TOPICAL | Status: DC | PRN
  Administered 2023-03-12: 1000 mL

## 2023-03-12 MED ORDER — PROPOFOL 10 MG/ML IV BOLUS
INTRAVENOUS | Status: AC
  Filled 2023-03-12: qty 20

## 2023-03-12 MED ORDER — VANCOMYCIN HCL 500 MG IV SOLR
INTRAVENOUS | Status: DC | PRN
  Administered 2023-03-12: 500 mg via TOPICAL

## 2023-03-12 MED ORDER — DEXAMETHASONE SODIUM PHOSPHATE 10 MG/ML IJ SOLN
INTRAMUSCULAR | Status: AC
  Filled 2023-03-12: qty 1

## 2023-03-12 MED ORDER — ONDANSETRON HCL 4 MG/2ML IJ SOLN
INTRAMUSCULAR | Status: DC | PRN
  Administered 2023-03-12: 4 mg via INTRAVENOUS

## 2023-03-12 MED ORDER — MIDAZOLAM HCL 2 MG/2ML IJ SOLN
2.0000 mg | Freq: Once | INTRAMUSCULAR | Status: AC
  Administered 2023-03-12: 2 mg via INTRAVENOUS

## 2023-03-12 MED ORDER — ACETAMINOPHEN 500 MG PO TABS
1000.0000 mg | ORAL_TABLET | Freq: Once | ORAL | Status: AC
  Administered 2023-03-12: 1000 mg via ORAL

## 2023-03-12 MED ORDER — SODIUM CHLORIDE 0.9 % IV SOLN: INTRAVENOUS | Status: DC | PRN

## 2023-03-12 MED ORDER — ACETAMINOPHEN 500 MG PO TABS
ORAL_TABLET | ORAL | Status: AC
  Filled 2023-03-12: qty 2

## 2023-03-12 MED ORDER — LIDOCAINE 2% (20 MG/ML) 5 ML SYRINGE
INTRAMUSCULAR | Status: AC
  Filled 2023-03-12: qty 5

## 2023-03-12 MED ORDER — MIDAZOLAM HCL 2 MG/2ML IJ SOLN
INTRAMUSCULAR | Status: AC
Start: 2023-03-12 — End: ?
  Filled 2023-03-12: qty 2

## 2023-03-12 MED ORDER — LACTATED RINGERS IV SOLN: INTRAVENOUS | Status: DC

## 2023-03-12 SURGICAL SUPPLY — 47 items
BANDAGE ESMARK 6X9 LF (GAUZE/BANDAGES/DRESSINGS) IMPLANT
BLADE SURG 15 STRL LF DISP TIS (BLADE) ×2 IMPLANT
BNDG ELASTIC 4INX 5YD STR LF (GAUZE/BANDAGES/DRESSINGS) IMPLANT
BNDG ELASTIC 6INX 5YD STR LF (GAUZE/BANDAGES/DRESSINGS) IMPLANT
BNDG ESMARK 6X9 LF (GAUZE/BANDAGES/DRESSINGS)
BOOT STEPPER DURA LG (SOFTGOODS) IMPLANT
BOOT STEPPER DURA MED (SOFTGOODS) IMPLANT
BOOT STEPPER DURA XLG (SOFTGOODS) IMPLANT
CHLORAPREP W/TINT 26 (MISCELLANEOUS) ×1 IMPLANT
COVER BACK TABLE 60X90IN (DRAPES) ×1 IMPLANT
CUFF TRNQT CYL 34X4.125X (TOURNIQUET CUFF) IMPLANT
DRAPE EXTREMITY T 121X128X90 (DISPOSABLE) ×1 IMPLANT
DRAPE U-SHAPE 47X51 STRL (DRAPES) ×1 IMPLANT
DRSG MEPITEL 4X7.2 (GAUZE/BANDAGES/DRESSINGS) ×1 IMPLANT
ELECT REM PT RETURN 9FT ADLT (ELECTROSURGICAL) ×1
ELECTRODE REM PT RTRN 9FT ADLT (ELECTROSURGICAL) ×1 IMPLANT
GAUZE PAD ABD 8X10 STRL (GAUZE/BANDAGES/DRESSINGS) ×1 IMPLANT
GAUZE SPONGE 4X4 12PLY STRL (GAUZE/BANDAGES/DRESSINGS) ×1 IMPLANT
GLOVE BIO SURGEON STRL SZ8 (GLOVE) ×1 IMPLANT
GLOVE BIOGEL PI IND STRL 8 (GLOVE) ×2 IMPLANT
GLOVE ECLIPSE 8.0 STRL XLNG CF (GLOVE) ×1 IMPLANT
GOWN STRL REUS W/ TWL LRG LVL3 (GOWN DISPOSABLE) ×1 IMPLANT
GOWN STRL REUS W/ TWL XL LVL3 (GOWN DISPOSABLE) ×2 IMPLANT
NDL HYPO 22X1.5 SAFETY MO (MISCELLANEOUS) IMPLANT
NDL HYPO 25X1 1.5 SAFETY (NEEDLE) IMPLANT
NEEDLE HYPO 22X1.5 SAFETY MO (MISCELLANEOUS) IMPLANT
NEEDLE HYPO 25X1 1.5 SAFETY (NEEDLE) IMPLANT
NS IRRIG 1000ML POUR BTL (IV SOLUTION) ×1 IMPLANT
PACK BASIN DAY SURGERY FS (CUSTOM PROCEDURE TRAY) ×1 IMPLANT
PAD CAST 4YDX4 CTTN HI CHSV (CAST SUPPLIES) ×1 IMPLANT
PADDING CAST COTTON 6X4 STRL (CAST SUPPLIES) IMPLANT
PENCIL SMOKE EVACUATOR (MISCELLANEOUS) ×1 IMPLANT
SANITIZER HAND PURELL FF 515ML (MISCELLANEOUS) ×1 IMPLANT
SHEET MEDIUM DRAPE 40X70 STRL (DRAPES) ×1 IMPLANT
SPLINT PLASTER CAST FAST 5X30 (CAST SUPPLIES) IMPLANT
SPONGE T-LAP 18X18 ~~LOC~~+RFID (SPONGE) ×1 IMPLANT
STOCKINETTE 6 STRL (DRAPES) ×1 IMPLANT
SUCTION TUBE FRAZIER 10FR DISP (SUCTIONS) IMPLANT
SUT ETHILON 3 0 PS 1 (SUTURE) ×1 IMPLANT
SUT MNCRL AB 3-0 PS2 18 (SUTURE) ×1 IMPLANT
SUT VICRYL 0 SH 27 (SUTURE) IMPLANT
SYR BULB EAR ULCER 3OZ GRN STR (SYRINGE) ×1 IMPLANT
SYR CONTROL 10ML LL (SYRINGE) IMPLANT
TOWEL GREEN STERILE FF (TOWEL DISPOSABLE) ×1 IMPLANT
TUBE CONNECTING 20X1/4 (TUBING) IMPLANT
UNDERPAD 30X36 HEAVY ABSORB (UNDERPADS AND DIAPERS) ×1 IMPLANT
YANKAUER SUCT BULB TIP NO VENT (SUCTIONS) IMPLANT

## 2023-03-12 NOTE — Anesthesia Postprocedure Evaluation (Signed)
Anesthesia Post Note  Patient: Advice worker  Procedure(s) Performed: GASTROCNEMIUS recession (Right: Leg Lower) PLANTAR FASCIA RELEASE (Right: Leg Lower)     Patient location during evaluation: PACU Anesthesia Type: General Level of consciousness: awake Pain management: pain level controlled Vital Signs Assessment: post-procedure vital signs reviewed and stable Respiratory status: spontaneous breathing, nonlabored ventilation and respiratory function stable Cardiovascular status: blood pressure returned to baseline and stable Postop Assessment: no apparent nausea or vomiting Anesthetic complications: no   No notable events documented.  Last Vitals:  Vitals:   03/12/23 1230 03/12/23 1245  BP: 112/77   Pulse: 76 69  Resp: 14 (!) 9  SpO2: 100% 96%    Last Pain:  Vitals:   03/12/23 1227  PainSc: 0-No pain                 Linton Rump

## 2023-03-12 NOTE — Op Note (Signed)
03/12/2023  12:22 PM  PATIENT:  Natalie Marquez  42 y.o. female  PRE-OPERATIVE DIAGNOSIS:  1.  Chronic right plantar fasciitis     2.  Short right achilles tendon  POST-OPERATIVE DIAGNOSIS:  same  Procedure(s): 1.  Right gastrocnemius recession   2.  Right plantar fascia release  SURGEON:  Toni Arthurs, MD  ASSISTANT: none  ANESTHESIA:   General, regional  EBL:  minimal   TOURNIQUET:   Total Tourniquet Time Documented: Thigh (Right) - 16 minutes Total: Thigh (Right) - 16 minutes  COMPLICATIONS:  None apparent  DISPOSITION:  Extubated, awake and stable to recovery.  INDICATION FOR PROCEDURE:  42 y/o female without significant PMH c/o many year history of right heel pain.  She has chronic plantar fasciitis and a tight heelcord.  She has failed non op treatment and present today for surgery.  The risks and benefits of the alternative treatment options have been discussed in detail.  The patient wishes to proceed with surgery and specifically understands risks of bleeding, infection, nerve damage, blood clots, need for additional surgery, amputation and death.   PROCEDURE IN DETAIL:  After pre operative consent was obtained, and the correct operative site was identified, the patient was brought to the operating room and placed supine on the OR table.  Anesthesia was administered.  Pre-operative antibiotics were administered.  A surgical timeout was taken.  The right lower extremity was prepped and draped in standard sterile fashion with a tourniquet around the thigh.  The extremity was elevated, and the tourniquet was inflated to 250 mmHg.  An incision was made over the medial calf.  Dissection was carried down through the subcutaneous tissues.  Care was taken to protect the saphenous nerve and vein.  The fascia was incised.  The gastrocnemius tendon was identified.  It was divided from medial to lateral under direct vision taking care to protect the sural nerve posteriorly.  The ankle was  then dorsiflex to 30 degrees with the knee extended.  The wound was irrigated and sprinkled with vancomycin powder.  Subcutaneous tissues were approximated with Monocryl.  Skin incision was closed with nylon.  Attention was turned to the medial proximal arch.  The hallux was dorsiflexed tightening the medial cord of the plantar fascia.  A small incision was made medially at the proximal end of the fascia.  Dissection was carried superficial to the fascia.  Under direct vision the medial cord of the plantar fascia was divided approximately 50% of his total width.  The wound was irrigated and sprinkled with vancomycin powder.  Skin incision was closed with nylon.  Sterile dressings were applied followed by a well-padded short leg splint.  The tourniquet was released after application of the dressings.  The patient was awakened from anesthesia and transported to the recovery room in stable condition.   FOLLOW UP PLAN: Nonweightbearing on the right lower extremity in a short leg splint.  Xarelto for DVT prophylaxis.  Follow-up in the office in 2 weeks for suture removal and conversion to a cam boot for weightbearing and range of motion.

## 2023-03-12 NOTE — Anesthesia Procedure Notes (Signed)
Anesthesia Regional Block: Adductor canal block   Pre-Anesthetic Checklist: , timeout performed,  Correct Patient, Correct Site, Correct Laterality,  Correct Procedure, Correct Position, site marked,  Risks and benefits discussed,  Surgical consent,  Pre-op evaluation,  At surgeon's request and post-op pain management  Laterality: Right  Prep: chloraprep       Needles:  Injection technique: Single-shot  Needle Type: Echogenic Stimulator Needle     Needle Length: 9cm  Needle Gauge: 21     Additional Needles:   Procedures:,,,, ultrasound used (permanent image in chart),,    Narrative:  Start time: 03/12/2023 10:33 AM End time: 03/12/2023 10:35 AM Injection made incrementally with aspirations every 5 mL.  Performed by: Personally  Anesthesiologist: Linton Rump, MD  Additional Notes: Discussed risks and benefits of nerve block including, but not limited to, prolonged and/or permanent nerve injury involving sensory and/or motor function. Monitors were applied and a time-out was performed. The nerve and associated structures were visualized under ultrasound guidance. After negative aspiration, local anesthetic was slowly injected around the nerve. There was no evidence of high pressure during the procedure. There were no paresthesias. VSS remained stable and the patient tolerated the procedure well.

## 2023-03-12 NOTE — Discharge Instructions (Addendum)
Toni Arthurs, MD [facility]  Please read the following information regarding your care after surgery.  Medications  You only need a prescription for the narcotic pain medicine (ex. oxycodone, Percocet, Norco).  All of the other medicines listed below are available over the counter. ? Aleve 2 pills twice a day for the first 3 days after surgery. ? acetominophen (Tylenol) 650 mg every 4-6 hours as you need for minor to moderate pain ? oxycodone as prescribed for severe pain  Narcotic pain medicine (ex. oxycodone, Percocet, Vicodin) will cause constipation.  To prevent this problem, take the following medicines while you are taking any pain medicine. ? docusate sodium (Colace) 100 mg twice a day ? senna (Senokot) 2 tablets twice a day  ? To help prevent blood clots, take Xarelto as prescribed for two weeks after surgery.  You should also get up every hour while you are awake to move around.    Weight Bearing ? Bear weight when you are able on your operated leg or foot. ? Bear weight only on your operated foot in the post-op shoe. ? Do not bear any weight on the operated leg or foot.  Cast / Splint / Dressing ? Keep your splint, cast or dressing clean and dry.  Don't put anything (coat hanger, pencil, etc) down inside of it.  If it gets damp, use a hair dryer on the cool setting to dry it.  If it gets soaked, call the office to schedule an appointment for a cast change.   After your dressing, cast or splint is removed; you may shower, but do not soak or scrub the wound.  Allow the water to run over it, and then gently pat it dry.  Swelling It is normal for you to have swelling where you had surgery.  To reduce swelling and pain, keep your toes above your nose for at least 3 days after surgery.  It may be necessary to keep your foot or leg elevated for several weeks.  If it hurts, it should be elevated.  Follow Up Call my office at [phone] when you are discharged from the hospital or  surgery center to schedule an appointment to be seen two weeks after surgery.  Call my office at [phone] if you develop a fever >101.5 F, nausea, vomiting, bleeding from the surgical site or severe pain.      Post Anesthesia Home Care Instructions  Activity: Get plenty of rest for the remainder of the day. A responsible individual must stay with you for 24 hours following the procedure.  For the next 24 hours, DO NOT: -Drive a car -Advertising copywriter -Drink alcoholic beverages -Take any medication unless instructed by your physician -Make any legal decisions or sign important papers.  Meals: Start with liquid foods such as gelatin or soup. Progress to regular foods as tolerated. Avoid greasy, spicy, heavy foods. If nausea and/or vomiting occur, drink only clear liquids until the nausea and/or vomiting subsides. Call your physician if vomiting continues.  Special Instructions/Symptoms: Your throat may feel dry or sore from the anesthesia or the breathing tube placed in your throat during surgery. If this causes discomfort, gargle with warm salt water. The discomfort should disappear within 24 hours.   May have Tylenol after 6pm if needed.     Regional Anesthesia Blocks  1. You may not be able to move or feel the "blocked" extremity after a regional anesthetic block. This may last may last from 3-48 hours after placement, but it will  go away. The length of time depends on the medication injected and your individual response to the medication. As the nerves start to wake up, you may experience tingling as the movement and feeling returns to your extremity. If the numbness and inability to move your extremity has not gone away after 48 hours, please call your surgeon.   2. The extremity that is blocked will need to be protected until the numbness is gone and the strength has returned. Because you cannot feel it, you will need to take extra care to avoid injury. Because it may be weak,  you may have difficulty moving it or using it. You may not know what position it is in without looking at it while the block is in effect.  3. For blocks in the legs and feet, returning to weight bearing and walking needs to be done carefully. You will need to wait until the numbness is entirely gone and the strength has returned. You should be able to move your leg and foot normally before you try and bear weight or walk. You will need someone to be with you when you first try to ensure you do not fall and possibly risk injury.  4. Bruising and tenderness at the needle site are common side effects and will resolve in a few days.  5. Persistent numbness or new problems with movement should be communicated to the surgeon or the [facility] [phone] [facility] [phone]).  Information for Discharge Teaching: EXPAREL (bupivacaine liposome injectable suspension)   Pain relief is important to your recovery. The goal is to control your pain so you can move easier and return to your normal activities as soon as possible after your procedure. Your physician may use several types of medicines to manage pain, swelling, and more.  Your surgeon or anesthesiologist gave you EXPAREL(bupivacaine) to help control your pain after surgery.  EXPAREL is a local anesthetic designed to release slowly over an extended period of time to provide pain relief by numbing the tissue around the surgical site. EXPAREL is designed to release pain medication over time and can control pain for up to 72 hours. Depending on how you respond to EXPAREL, you may require less pain medication during your recovery. EXPAREL can help reduce or eliminate the need for opioids during the first few days after surgery when pain relief is needed the most. EXPAREL is not an opioid and is not addictive. It does not cause sleepiness or sedation.   Important! A teal colored band has been placed on your arm with  the date, time and amount of EXPAREL you have received. Please leave this armband in place for the full 96 hours following administration, and then you may remove the band. If you return to the hospital for any reason within 96 hours following the administration of EXPAREL, the armband provides important information that your health care providers to know, and alerts them that you have received this anesthetic.    Possible side effects of EXPAREL: Temporary loss of sensation or ability to move in the area where medication was injected. Nausea, vomiting, constipation Rarely, numbness and tingling in your mouth or lips, lightheadedness, or anxiety may occur. Call your doctor right away if you think you may be experiencing any of these sensations, or if you have other questions regarding possible side effects.  Follow all other discharge instructions given to you by your surgeon or nurse. Eat a healthy diet and drink plenty of  water or other fluids.

## 2023-03-12 NOTE — Progress Notes (Signed)
Assisted Dr. Jennifer Allan with right, adductor canal, popliteal, ultrasound guided block. Side rails up, monitors on throughout procedure. See vital signs in flow sheet. Tolerated Procedure well. 

## 2023-03-12 NOTE — Anesthesia Procedure Notes (Signed)
Procedure Name: LMA Insertion Date/Time: 03/12/2023 11:37 AM  Performed by: Cleda Clarks, CRNAPre-anesthesia Checklist: Patient identified, Emergency Drugs available, Suction available and Patient being monitored Patient Re-evaluated:Patient Re-evaluated prior to induction Oxygen Delivery Method: Circle system utilized Preoxygenation: Pre-oxygenation with 100% oxygen Induction Type: IV induction Ventilation: Mask ventilation without difficulty LMA: LMA inserted LMA Size: 4.0 Number of attempts: 1 Placement Confirmation: positive ETCO2 Tube secured with: Tape Dental Injury: Teeth and Oropharynx as per pre-operative assessment

## 2023-03-12 NOTE — Anesthesia Procedure Notes (Signed)
Anesthesia Regional Block: Popliteal block   Pre-Anesthetic Checklist: , timeout performed,  Correct Patient, Correct Site, Correct Laterality,  Correct Procedure, Correct Position, site marked,  Risks and benefits discussed,  Surgical consent,  Pre-op evaluation,  At surgeon's request and post-op pain management  Laterality: Right  Prep: chloraprep       Needles:  Injection technique: Single-shot  Needle Type: Echogenic Stimulator Needle     Needle Length: 9cm  Needle Gauge: 21     Additional Needles:   Procedures:,,,, ultrasound used (permanent image in chart),,    Narrative:  Start time: 03/12/2023 10:29 AM End time: 03/12/2023 10:32 AM Injection made incrementally with aspirations every 5 mL.  Performed by: Personally  Anesthesiologist: Linton Rump, MD  Additional Notes: Discussed risks and benefits of nerve block including, but not limited to, prolonged and/or permanent nerve injury involving sensory and/or motor function. Monitors were applied and a time-out was performed. The nerve and associated structures were visualized under ultrasound guidance. After negative aspiration, local anesthetic was slowly injected around the nerve. There was no evidence of high pressure during the procedure. There were no paresthesias. VSS remained stable and the patient tolerated the procedure well.

## 2023-03-12 NOTE — Transfer of Care (Signed)
Immediate Anesthesia Transfer of Care Note  Patient: Natalie Marquez  Procedure(s) Performed: GASTROCNEMIUS recession (Right: Leg Lower) PLANTAR FASCIA RELEASE (Right: Leg Lower)  Patient Location: PACU  Anesthesia Type:General  Level of Consciousness: awake, alert , and oriented  Airway & Oxygen Therapy: Patient Spontanous Breathing and Patient connected to face mask oxygen  Post-op Assessment: Report given to RN and Post -op Vital signs reviewed and stable  Post vital signs: Reviewed and stable  Last Vitals:  Vitals Value Taken Time  BP 99/57 03/12/23 1210  Temp    Pulse 78 03/12/23 1213  Resp 12 03/12/23 1213  SpO2 99 % 03/12/23 1213  Vitals shown include unfiled device data.  Last Pain:  Vitals:   03/12/23 0951  PainSc: 0-No pain      Patients Stated Pain Goal: 3 (03/12/23 0951)  Complications: No notable events documented.

## 2023-03-12 NOTE — H&P (Signed)
Natalie Marquez is an 42 y.o. female.   Chief Complaint: Chronic right heel pain HPI: 42 year old female without significant past medical history complains of chronic right heel pain worsening over the last several years.  She has failed nonoperative treatment including activity modification, oral anti-inflammatories, immobilization and physical therapy.  She presents today for surgical treatment.  Past Medical History:  Diagnosis Date   Anemia    Asthma    Cellulitis and abscess of left leg    GERD (gastroesophageal reflux disease)    Plantar fasciitis     Past Surgical History:  Procedure Laterality Date   APPENDECTOMY     HERNIA REPAIR     WISDOM TOOTH EXTRACTION      Family History  Problem Relation Age of Onset   Hypertension Mother    Hypertension Father    Cancer Maternal Grandmother        uterine   Heart disease Maternal Grandfather    Thyroid disease Paternal Grandmother    Social History:  reports that she has never smoked. She has never used smokeless tobacco. She reports that she does not drink alcohol and does not use drugs.  Allergies:  Allergies  Allergen Reactions   Penicillins Rash    Has patient had a PCN reaction causing immediate rash, facial/tongue/throat swelling, SOB or lightheadedness with hypotension: Yes Has patient had a PCN reaction causing severe rash involving mucus membranes or skin necrosis: Yes Has patient had a PCN reaction that required hospitalization: No Has patient had a PCN reaction occurring within the last 10 years: No If all of the above answers are "NO", then may proceed with Cephalosporin use.     Medications Prior to Admission  Medication Sig Dispense Refill   levonorgestrel (MIRENA) 20 MCG/DAY IUD 1 each by Intrauterine route once.     loratadine (CLARITIN) 10 MG tablet Take 10 mg by mouth daily.      Results for orders placed or performed during the hospital encounter of 03/25/23 (from the past 48 hour(s))  Pregnancy,  urine POC     Status: None   Collection Time: 2023-03-25  9:38 AM  Result Value Ref Range   Preg Test, Ur NEGATIVE NEGATIVE    Comment:        THE SENSITIVITY OF THIS METHODOLOGY IS >24 mIU/mL    No results found.  Review of Systems no recent fever, chills, nausea, vomiting or changes in her appetite  Blood pressure (!) 156/95, pulse 81, resp. rate 20, height 5\' 3"  (1.6 m), weight 80.3 kg, SpO2 97%, unknown if currently breastfeeding. Physical Exam  Well-nourished well-developed woman in no apparent distress.  Alert and oriented.  Normal mood and affect.  Gait is antalgic to the right.  The right heel is tender to palpation.  The right foot has healthy skin and palpable pulses.  Intact sensibility to light touch in the sural nerve distribution.  5 out of 5 strength in plantarflexion at the ankle.  Heel cord is tight.   Assessment/Plan Chronic right plantar fasciitis and tight heel cord -to the operating room today for gastrocnemius recession and plantar fascial release.The risks and benefits of the alternative treatment options have been discussed in detail.  The patient wishes to proceed with surgery and specifically understands risks of bleeding, infection, nerve damage, blood clots, need for additional surgery, amputation and death.   Toni Arthurs, MD 03-25-23, 10:11 AM

## 2023-03-16 ENCOUNTER — Encounter (HOSPITAL_BASED_OUTPATIENT_CLINIC_OR_DEPARTMENT_OTHER): Payer: Self-pay | Admitting: Orthopedic Surgery

## 2023-09-28 ENCOUNTER — Encounter: Payer: Self-pay | Admitting: Family Medicine

## 2023-09-28 DIAGNOSIS — N6452 Nipple discharge: Secondary | ICD-10-CM

## 2024-05-04 ENCOUNTER — Encounter: Payer: Self-pay | Admitting: Podiatry

## 2024-05-04 ENCOUNTER — Ambulatory Visit (INDEPENDENT_AMBULATORY_CARE_PROVIDER_SITE_OTHER): Payer: PRIVATE HEALTH INSURANCE

## 2024-05-04 ENCOUNTER — Ambulatory Visit: Payer: PRIVATE HEALTH INSURANCE

## 2024-05-04 ENCOUNTER — Ambulatory Visit: Payer: PRIVATE HEALTH INSURANCE | Admitting: Podiatry

## 2024-05-04 VITALS — Ht 63.0 in | Wt 177.0 lb

## 2024-05-04 DIAGNOSIS — M7751 Other enthesopathy of right foot: Secondary | ICD-10-CM

## 2024-05-04 DIAGNOSIS — M778 Other enthesopathies, not elsewhere classified: Secondary | ICD-10-CM

## 2024-05-04 MED ORDER — TRIAMCINOLONE ACETONIDE 10 MG/ML IJ SUSP
10.0000 mg | Freq: Once | INTRAMUSCULAR | Status: AC
  Administered 2024-05-04: 10 mg via INTRA_ARTICULAR

## 2024-05-04 NOTE — Progress Notes (Signed)
 Subjective:   Patient ID: Natalie Marquez, female   DOB: 44 y.o.   MRN: [id number]   HPI Patient presents stating that she has had previous gastroc surgery and plantar fascial release in 24 and that she had this done by Dr. Kit.  States that she is having a lot of problems in the outside of her ankle and she feels like her foot just does not function well and she cannot work 12-hour shifts.  Patient does not smoke likes to be active   Review of Systems  All other systems reviewed and are negative.       Objective:  Physical Exam Vitals and nursing note reviewed.  Constitutional:      Appearance: She is well-developed.  Pulmonary:     Effort: Pulmonary effort is normal.  Musculoskeletal:        General: Normal range of motion.  Skin:    General: Skin is warm.  Neurological:     Mental Status: She is alert.     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  Reappoint 4 weeks to reevaluate and we will keep her to 8-hour shifts currently  X-rays indicate no signs of arthritis mild reduction of arch height plantar spur formation

## 2024-05-25 ENCOUNTER — Encounter: Payer: Self-pay | Admitting: Podiatry

## 2024-05-25 ENCOUNTER — Ambulatory Visit: Payer: PRIVATE HEALTH INSURANCE | Admitting: Podiatry

## 2024-05-25 DIAGNOSIS — M7751 Other enthesopathy of right foot: Secondary | ICD-10-CM | POA: Diagnosis not present

## 2024-05-25 DIAGNOSIS — M7752 Other enthesopathy of left foot: Secondary | ICD-10-CM

## 2024-05-25 MED ORDER — MELOXICAM 15 MG PO TABS: 15.0000 mg | ORAL_TABLET | Freq: Every day | ORAL | 2 refills | Status: AC

## 2024-05-25 NOTE — Progress Notes (Signed)
 Subjective:   Patient ID: Natalie Marquez, female   DOB: 44 y.o.   MRN: [id number]   HPI Patient states she is somewhat improved but still having pain and feels like she is going to need some kind of orthotics to elevate her arches of both feet   ROS      Objective:  Physical Exam  Neuro vascular status intact with the patient found to have depressed arches bilateral inflammation sinus tarsi right present but improved     Assessment:  Sinus tarsitis right improved present still     Plan:  Reviewed condition and patient is casted for functional orthotic devices to get elevation to the arch and take stress off for sinus tarsi bilateral.  Patient will be seen back 4 weeks casting is done today by professional and submitted for fabrication

## 2024-06-22 ENCOUNTER — Ambulatory Visit: Payer: PRIVATE HEALTH INSURANCE | Admitting: Podiatry
